# Patient Record
Sex: Male | Born: 1995 | Race: Black or African American | Hispanic: No | Marital: Single | Smoking: Former smoker
Health system: Southern US, Community
[De-identification: ages and names within clinical notes are randomized; demographics above are authoritative.]

---
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
## 2015-01-28 ENCOUNTER — Emergency Department (HOSPITAL_COMMUNITY)
Admission: EM | Admit: 2015-01-28 | Discharge: 2015-01-28 | Disposition: A | Discharge status: Home / Self Care | Payer: Medicaid Other | Attending: Emergency Medicine | Admitting: Emergency Medicine

## 2015-01-28 ENCOUNTER — Encounter (HOSPITAL_COMMUNITY): Payer: Self-pay | Admitting: *Deleted

## 2015-01-28 DIAGNOSIS — Z202 Contact with and (suspected) exposure to infections with a predominantly sexual mode of transmission: Secondary | ICD-10-CM | POA: Insufficient documentation

## 2015-01-28 DIAGNOSIS — Z791 Long term (current) use of non-steroidal anti-inflammatories (NSAID): Secondary | ICD-10-CM | POA: Insufficient documentation

## 2015-01-28 DIAGNOSIS — Z79899 Other long term (current) drug therapy: Secondary | ICD-10-CM | POA: Insufficient documentation

## 2015-01-28 DIAGNOSIS — Z720 Tobacco use: Secondary | ICD-10-CM | POA: Insufficient documentation

## 2015-01-28 MED ORDER — CEFTRIAXONE SODIUM 250 MG IJ SOLR
250.0000 mg | Freq: Once | INTRAMUSCULAR | Status: AC
  Administered 2015-01-28: 250 mg via INTRAMUSCULAR
  Filled 2015-01-28: qty 250

## 2015-01-28 MED ORDER — STERILE WATER FOR INJECTION IJ SOLN
INTRAMUSCULAR | Status: AC
  Administered 2015-01-28: 1.2 mL
  Filled 2015-01-28: qty 10

## 2015-01-28 MED ORDER — AZITHROMYCIN 250 MG PO TABS
1000.0000 mg | ORAL_TABLET | Freq: Every day | ORAL | Status: DC
  Administered 2015-01-28: 1000 mg via ORAL
  Filled 2015-01-28: qty 4

## 2015-01-28 NOTE — Discharge Instructions (Signed)
You have been treated for gonorrhea and chlamydia. Please follow-up with the health department if you have any additional STD needs.

## 2015-01-28 NOTE — ED Notes (Signed)
The pt is here because his sexual partner callked and tokld him 2 days ago that she had a std.  He has no symptoms but oral sex was involved

## 2015-01-28 NOTE — ED Provider Notes (Signed)
CSN: [id number]     Arrival date & time 01/28/15  1954 History  This chart was scribed for non-physician practitioner, Sharilyn SitesLisa Bria Portales, PA-C working with [name], MD by Gwenyth Oberatherine Macek, ED scribe. This patient was seen in room TR08C/TR08C and the patient's care was started at 8:47 PM   Chief Complaint  Patient presents with  . Exposure to STD   The history is provided by the patient. No language interpreter was used.   HPI Comments: Cody Gibson is a 19 y.o. male who presents to the Emergency Department for possible STD exposure. Pt reports that his girlfriend told him 2 days ago that she tested positive for gonorrhea. He notes that she is his only partner and endorses unprotected oral sex. Pt denies fever, chills, dysuria, penile discharge, abdominal pain, swelling of his penis or scrotum, joint pain and rash as associated symptoms.   History reviewed. No pertinent past medical history. History reviewed. No pertinent past surgical history. No family history on file. History  Substance Use Topics  . Smoking status: Current Every Day Smoker -- 0.25 packs/day    Types: Cigarettes  . Smokeless tobacco: Not on file  . Alcohol Use: No    Review of Systems  Constitutional: Negative for fever and chills.  Genitourinary: Negative for dysuria, discharge, penile swelling, scrotal swelling and genital sores.  Skin: Negative for rash.  All other systems reviewed and are negative.     Allergies  Review of patient's allergies indicates no known allergies.  Home Medications   Prior to Admission medications   Medication Sig Start Date End Date Taking? Authorizing Provider  cyclobenzaprine (FLEXERIL) 10 MG tablet Take 1 tablet (10 mg total) by mouth 2 (two) times daily as needed for muscle spasms. Patient not taking: Reported on 11/27/2014 10/20/14   Elpidio AnisShari Upstill, PA-C  ibuprofen (ADVIL,MOTRIN) 800 MG tablet Take 1 tablet (800 mg total) by mouth 3 (three) times daily. 10/20/14   Elpidio AnisShari  Upstill, PA-C  phenazopyridine (PYRIDIUM) 200 MG tablet Take 1 tablet (200 mg total) by mouth 3 (three) times daily. 11/28/14   Junius FinnerErin O'Malley, PA-C   BP 129/77 mmHg  Pulse 93  Temp(Src) 98.3 F (36.8 C)  Resp 16  Ht 5\' 7"  (1.702 m)  Wt 139 lb (63.05 kg)  BMI 21.77 kg/m2  SpO2 97%   Physical Exam  Constitutional: He is oriented to person, place, and time. He appears well-developed and well-nourished.  HENT:  Head: Normocephalic and atraumatic.  Mouth/Throat: Oropharynx is clear and moist.  Eyes: Conjunctivae and EOM are normal. Pupils are equal, round, and reactive to light.  Neck: Normal range of motion.  Cardiovascular: Normal rate, regular rhythm and normal heart sounds.   Pulmonary/Chest: Effort normal and breath sounds normal.  Abdominal: Soft. Bowel sounds are normal.  Musculoskeletal: Normal range of motion.  Neurological: He is alert and oriented to person, place, and time.  Skin: Skin is warm and dry.  Psychiatric: He has a normal mood and affect.  Nursing note and vitals reviewed.   ED Course  Procedures   DIAGNOSTIC STUDIES: Oxygen Saturation is 97% on RA, normal by my interpretation.    COORDINATION OF CARE: 8:52 PM Discussed treatment plan with pt which includes Rocephin and Zithromax. Pt agreed to plan.   Labs Review Labs Reviewed - No data to display  Imaging Review No results found.   EKG Interpretation None      MDM   Final diagnoses:  Exposure to STD   19 year old male  here after exposure to gonorrhea as current sexual partner. He is asymptomatic. He has declined genital swab and opted for treatment only. Patient was given Rocephin and azithromycin in the ED. He was encouraged to use safe sex practices and to follow-up with the health department for any further STD needs.  Discussed plan with patient, he/she acknowledged understanding and agreed with plan of care.  Return precautions given for new or worsening symptoms.  I personally performed  the services described in this documentation, which was scribed in my presence. The recorded information has been reviewed and is accurate.  [name], PA-C 01/28/15 2133  [name], MD 01/28/15 [phone]

## 2015-01-28 NOTE — ED Notes (Signed)
Pt in stating his girlfriend tested positive for an STD and he is here for treatment, denies symptoms, no distress noted

## 2015-03-04 ENCOUNTER — Emergency Department (HOSPITAL_COMMUNITY): Payer: Medicaid Other

## 2015-03-04 ENCOUNTER — Encounter (HOSPITAL_COMMUNITY): Payer: Self-pay | Admitting: *Deleted

## 2015-03-04 ENCOUNTER — Emergency Department (HOSPITAL_COMMUNITY)
Admission: EM | Admit: 2015-03-04 | Discharge: 2015-03-04 | Disposition: A | Discharge status: Home / Self Care | Payer: Medicaid Other | Attending: Emergency Medicine | Admitting: Emergency Medicine

## 2015-03-04 DIAGNOSIS — Y9389 Activity, other specified: Secondary | ICD-10-CM | POA: Insufficient documentation

## 2015-03-04 DIAGNOSIS — Y929 Unspecified place or not applicable: Secondary | ICD-10-CM | POA: Insufficient documentation

## 2015-03-04 DIAGNOSIS — Y990 Civilian activity done for income or pay: Secondary | ICD-10-CM | POA: Insufficient documentation

## 2015-03-04 DIAGNOSIS — W208XXA Other cause of strike by thrown, projected or falling object, initial encounter: Secondary | ICD-10-CM | POA: Insufficient documentation

## 2015-03-04 DIAGNOSIS — N342 Other urethritis: Secondary | ICD-10-CM | POA: Insufficient documentation

## 2015-03-04 DIAGNOSIS — S60221A Contusion of right hand, initial encounter: Secondary | ICD-10-CM | POA: Insufficient documentation

## 2015-03-04 DIAGNOSIS — Z720 Tobacco use: Secondary | ICD-10-CM | POA: Insufficient documentation

## 2015-03-04 MED ORDER — CEFTRIAXONE SODIUM 250 MG IJ SOLR
250.0000 mg | Freq: Once | INTRAMUSCULAR | Status: AC
  Administered 2015-03-04: 250 mg via INTRAMUSCULAR
  Filled 2015-03-04: qty 250

## 2015-03-04 MED ORDER — AZITHROMYCIN 250 MG PO TABS
1000.0000 mg | ORAL_TABLET | Freq: Once | ORAL | Status: AC
  Administered 2015-03-04: 1000 mg via ORAL
  Filled 2015-03-04: qty 4

## 2015-03-04 MED ORDER — IBUPROFEN 800 MG PO TABS: 800.0000 mg | ORAL_TABLET | Freq: Three times a day (TID) | ORAL | Status: AC

## 2015-03-04 MED ORDER — LIDOCAINE HCL (PF) 1 % IJ SOLN
INTRAMUSCULAR | Status: AC
  Administered 2015-03-04: 1 mL
  Filled 2015-03-04: qty 5

## 2015-03-04 NOTE — Discharge Instructions (Signed)
Cryotherapy °Cryotherapy means treatment with cold. Ice or gel packs can be used to reduce both pain and swelling. Ice is the most helpful within the first 24 to 48 hours after an injury or flare-up from overusing a muscle or joint. Sprains, strains, spasms, burning pain, shooting pain, and aches can all be eased with ice. Ice can also be used when recovering from surgery. Ice is effective, has very few side effects, and is safe for most people to use. °PRECAUTIONS  °Ice is not a safe treatment option for people with: °· Raynaud phenomenon. This is a condition affecting small blood vessels in the extremities. Exposure to cold may cause your problems to return. °· Cold hypersensitivity. There are many forms of cold hypersensitivity, including: °¨ Cold urticaria. Red, itchy hives appear on the skin when the tissues begin to warm after being iced. °¨ Cold erythema. This is a red, itchy rash caused by exposure to cold. °¨ Cold hemoglobinuria. Red blood cells break down when the tissues begin to warm after being iced. The hemoglobin that carry oxygen are passed into the urine because they cannot combine with blood proteins fast enough. °· Numbness or altered sensitivity in the area being iced. °If you have any of the following conditions, do not use ice until you have discussed cryotherapy with your caregiver: °· Heart conditions, such as arrhythmia, angina, or chronic heart disease. °· High blood pressure. °· Healing wounds or open skin in the area being iced. °· Current infections. °· Rheumatoid arthritis. °· Poor circulation. °· Diabetes. °Ice slows the blood flow in the region it is applied. This is beneficial when trying to stop inflamed tissues from spreading irritating chemicals to surrounding tissues. However, if you expose your skin to cold temperatures for too long or without the proper protection, you can damage your skin or nerves. Watch for signs of skin damage due to cold. °HOME CARE INSTRUCTIONS °Follow  these tips to use ice and cold packs safely. °· Place a dry or damp towel between the ice and skin. A damp towel will cool the skin more quickly, so you may need to shorten the time that the ice is used. °· For a more rapid response, add gentle compression to the ice. °· Ice for no more than 10 to 20 minutes at a time. The bonier the area you are icing, the less time it will take to get the benefits of ice. °· Check your skin after 5 minutes to make sure there are no signs of a poor response to cold or skin damage. °· Rest 20 minutes or more between uses. °· Once your skin is numb, you can end your treatment. You can test numbness by very lightly touching your skin. The touch should be so light that you do not see the skin dimple from the pressure of your fingertip. When using ice, most people will feel these normal sensations in this order: cold, burning, aching, and numbness. °· Do not use ice on someone who cannot communicate their responses to pain, such as small children or people with dementia. °HOW TO MAKE AN ICE PACK °Ice packs are the most common way to use ice therapy. Other methods include ice massage, ice baths, and cryosprays. Muscle creams that cause a cold, tingly feeling do not offer the same benefits that ice offers and should not be used as a substitute unless recommended by your caregiver. °To make an ice pack, do one of the following: °· Place crushed ice or a   bag of frozen vegetables in a sealable plastic bag. Squeeze out the excess air. Place this bag inside another plastic bag. Slide the bag into a pillowcase or place a damp towel between your skin and the bag.  Mix 3 parts water with 1 part rubbing alcohol. Freeze the mixture in a sealable plastic bag. When you remove the mixture from the freezer, it will be slushy. Squeeze out the excess air. Place this bag inside another plastic bag. Slide the bag into a pillowcase or place a damp towel between your skin and the bag. SEEK MEDICAL CARE  IF:  You develop white spots on your skin. This may give the skin a blotchy (mottled) appearance.  Your skin turns blue or pale.  Your skin becomes waxy or hard.  Your swelling gets worse. MAKE SURE YOU:   Understand these instructions.  Will watch your condition.  Will get help right away if you are not doing well or get worse. Document Released: 05/02/2011 Document Revised: 01/20/2014 Document Reviewed: 05/02/2011 [facility] Patient Information 2015 [place], [place]. This information is not intended to replace advice given to you by your health care provider. Make sure you discuss any questions you have with your health care provider.  Contusion A contusion is a deep bruise. Contusions are the result of an injury that caused bleeding under the skin. The contusion may turn blue, purple, or yellow. Minor injuries will give you a painless contusion, but more severe contusions may stay painful and swollen for a few weeks.  CAUSES  A contusion is usually caused by a blow, trauma, or direct force to an area of the body. SYMPTOMS   Swelling and redness of the injured area.  Bruising of the injured area.  Tenderness and soreness of the injured area.  Pain. DIAGNOSIS  The diagnosis can be made by taking a history and physical exam. An X-ray, CT scan, or MRI may be needed to determine if there were any associated injuries, such as fractures. TREATMENT  Specific treatment will depend on what area of the body was injured. In general, the best treatment for a contusion is resting, icing, elevating, and applying cold compresses to the injured area. Over-the-counter medicines may also be recommended for pain control. Ask your caregiver what the best treatment is for your contusion. HOME CARE INSTRUCTIONS   Put ice on the injured area.  Put ice in a plastic bag.  Place a towel between your skin and the bag.  Leave the ice on for 15-20 minutes, 3-4 times a day, or as directed by your health  care provider.  Only take over-the-counter or prescription medicines for pain, discomfort, or fever as directed by your caregiver. Your caregiver may recommend avoiding anti-inflammatory medicines (aspirin, ibuprofen, and naproxen) for 48 hours because these medicines may increase bruising.  Rest the injured area.  If possible, elevate the injured area to reduce swelling. SEEK IMMEDIATE MEDICAL CARE IF:   You have increased bruising or swelling.  You have pain that is getting worse.  Your swelling or pain is not relieved with medicines. MAKE SURE YOU:   Understand these instructions.  Will watch your condition.  Will get help right away if you are not doing well or get worse. Document Released: 06/15/2005 Document Revised: 09/10/2013 Document Reviewed: 07/11/2011 [facility] Patient Information 2015 [place], [place]. This information is not intended to replace advice given to you by your health care provider. Make sure you discuss any questions you have with your health care provider. Safe  Sex Safe sex is about reducing the risk of giving or getting a sexually transmitted disease (STD). STDs are spread through sexual contact involving the genitals, mouth, or rectum. Some STDs can be cured and others cannot. Safe sex can also prevent unintended pregnancies.  WHAT ARE SOME SAFE SEX PRACTICES?  Limit your sexual activity to only one partner who is having sex with only you.  Talk to your partner about his or her past partners, past STDs, and drug use.  Use a condom every time you have sexual intercourse. This includes vaginal, oral, and anal sexual activity. Both females and males should wear condoms during oral sex. Only use latex or polyurethane condoms and water-based lubricants. Using petroleum-based lubricants or oils to lubricate a condom will weaken the condom and increase the chance that it will break. The condom should be in place from the beginning to the end of sexual activity.  Wearing a condom reduces, but does not completely eliminate, your risk of getting or giving an STD. STDs can be spread by contact with infected body fluids and skin.  Get vaccinated for hepatitis B and HPV.  Avoid alcohol and recreational drugs, which can affect your judgment. You may forget to use a condom or participate in high-risk sex.  For females, avoid douching after sexual intercourse. Douching can spread an infection farther into the reproductive tract.  Check your body for signs of sores, blisters, rashes, or unusual discharge. See your health care provider if you notice any of these signs.  Avoid sexual contact if you have symptoms of an infection or are being treated for an STD. If you or your partner has herpes, avoid sexual contact when blisters are present. Use condoms at all other times.  If you are at risk of being infected with HIV, it is recommended that you take a prescription medicine daily to prevent HIV infection. This is called pre-exposure prophylaxis (PrEP). You are considered at risk if:  You are a man who has sex with other men (MSM).  You are a heterosexual man or woman who is sexually active with more than one partner.  You take drugs by injection.  You are sexually active with a partner who has HIV.  Talk with your health care provider about whether you are at high risk of being infected with HIV. If you choose to begin PrEP, you should first be tested for HIV. You should then be tested every 3 months for as long as you are taking PrEP.  See your health care provider for regular screenings, exams, and tests for other STDs. Before having sex with a new partner, each of you should be screened for STDs and should talk about the results with each other. WHAT ARE THE BENEFITS OF SAFE SEX?   There is less chance of getting or giving an STD.  You can prevent unwanted or unintended pregnancies.  By discussing safe sex concerns with your partner, you may increase  feelings of intimacy, comfort, trust, and honesty between the two of you. Document Released: 10/13/2004 Document Revised: 01/20/2014 Document Reviewed: 02/27/2012 [facility] Patient Information 2015 [place], [place]. This information is not intended to replace advice given to you by your health care provider. Make sure you discuss any questions you have with your health care provider.

## 2015-03-04 NOTE — ED Notes (Signed)
Pt states he also wants to get checked for chlamydia, states his partner has it and he has unprotected sex.

## 2015-03-04 NOTE — ED Notes (Signed)
Patient transported to X-ray 

## 2015-03-04 NOTE — ED Notes (Signed)
Pt states works for a [employer], plates got dropped on pts R hand, swelling noted to pts R hand.

## 2015-03-04 NOTE — ED Provider Notes (Signed)
CSN: [id number]     Arrival date & time 03/04/15  2050 History  This chart was scribed for Elpidio Anis, PA-C, working with Doug Sou, MD by Chestine Spore, ED Scribe. The patient was seen in room WTR7/WTR7 at 9:58 PM.    Chief Complaint  Patient presents with  . Hand Injury    right      The history is provided by the patient. No language interpreter was used.    HPI Comments: Cody Gibson is a 19 y.o. male who presents to the Emergency Department complaining of right hand injury onset yesterday. He reports that he works for a [employer] and plates were dropped onto his right hand with swelling since. Pt also notes that he would like to have a STD check because he recently had unprotected sex with his partner who was dx with chlamydia. He states that he is having associated symptoms of dysuria x 4-5 days. He denies penile discharge, fever, and any other symptoms.   History reviewed. No pertinent past medical history. History reviewed. No pertinent past surgical history. No family history on file. History  Substance Use Topics  . Smoking status: Current Every Day Smoker -- 0.25 packs/day    Types: Cigarettes  . Smokeless tobacco: Not on file  . Alcohol Use: No    Review of Systems  Constitutional: Negative for fever.  Genitourinary: Positive for dysuria. Negative for discharge.  Musculoskeletal: Positive for joint swelling and arthralgias.      Allergies  Review of patient's allergies indicates no known allergies.  Home Medications   Prior to Admission medications   Medication Sig Start Date End Date Taking? Authorizing Provider  cyclobenzaprine (FLEXERIL) 10 MG tablet Take 1 tablet (10 mg total) by mouth 2 (two) times daily as needed for muscle spasms. Patient not taking: Reported on 11/27/2014 10/20/14   Elpidio Anis, PA-C  ibuprofen (ADVIL,MOTRIN) 800 MG tablet Take 1 tablet (800 mg total) by mouth 3 (three) times daily. Patient not taking: Reported on  03/04/2015 10/20/14   Elpidio Anis, PA-C  phenazopyridine (PYRIDIUM) 200 MG tablet Take 1 tablet (200 mg total) by mouth 3 (three) times daily. Patient not taking: Reported on 03/04/2015 11/28/14   Junius Finner, PA-C   BP 140/72 mmHg  Pulse 79  Temp(Src) 98.8 F (37.1 C) (Oral)  Resp 16  SpO2 100% Physical Exam  Constitutional: He is oriented to person, place, and time. He appears well-developed and well-nourished. No distress.  HENT:  Head: Normocephalic and atraumatic.  Eyes: EOM are normal.  Neck: Neck supple. No tracheal deviation present.  Cardiovascular: Normal rate.   Pulmonary/Chest: Effort normal. No respiratory distress.  Genitourinary: Right testis shows no swelling. Left testis shows no swelling. Circumcised. No penile tenderness. No discharge found.  Musculoskeletal: Normal range of motion.  Swollen right hand over the fourth and fifth metacarpals. No wounds. FROM of the DIP and PIP joints of the fourth and fifth fingers. Limited flexion at the MCP joints due to pain and swelling. No tendon deficients.  Neurological: He is alert and oriented to person, place, and time.  Skin: Skin is warm and dry.  Psychiatric: He has a normal mood and affect. His behavior is normal.  Nursing note and vitals reviewed.   ED Course  Procedures (including critical care time) DIAGNOSTIC STUDIES: Oxygen Saturation is 100% on RA, nl by my interpretation.    COORDINATION OF CARE: 10:01 PM-Discussed treatment plan which includes right hand x-ray with pt at bedside and pt  agreed to plan.   Labs Review Labs Reviewed  RPR  HIV ANTIBODY (ROUTINE TESTING)  GC/CHLAMYDIA PROBE AMP ([place]) NOT AT [facility]    Imaging Review Dg Hand Complete Right  03/04/2015   CLINICAL DATA:  Pain and swelling of the fourth and fifth metacarpal area after dropped a platter on the hand tonight.  EXAM: RIGHT HAND - COMPLETE 3+ VIEW  COMPARISON:  08/02/2006  FINDINGS: There is no evidence of fracture or  dislocation. There is no evidence of arthropathy or other focal bone abnormality. Soft tissues are unremarkable.  IMPRESSION: Negative.   Electronically Signed   By: Burman Nieves M.D.   On: 03/04/2015 22:32     EKG Interpretation None      MDM   Final diagnoses:  None    1. Right hand contusion 2. Urethritis  Symptoms of urethritis and STD exposure treated with abx in ED. HIV/RPR/GC/chlamydia pending.   Hand x-ray negative for fracture, requiring supportive care.   I personally performed the services described in this documentation, which was scribed in my presence. The recorded information has been reviewed and is accurate.     Elpidio Anis, PA-C 03/05/15 0762  Doug Sou, MD 03/05/15 [phone]

## 2015-03-05 LAB — HIV ANTIBODY (ROUTINE TESTING W REFLEX): HIV SCREEN 4TH GENERATION: NONREACTIVE

## 2015-03-05 LAB — RPR: RPR: NONREACTIVE

## 2015-03-05 LAB — GC/CHLAMYDIA PROBE AMP (~~LOC~~) NOT AT ARMC
Chlamydia: NEGATIVE
Neisseria Gonorrhea: NEGATIVE

## 2015-06-21 ENCOUNTER — Encounter (HOSPITAL_COMMUNITY): Payer: Self-pay | Admitting: Emergency Medicine

## 2015-06-21 ENCOUNTER — Emergency Department (HOSPITAL_COMMUNITY)
Admission: EM | Admit: 2015-06-21 | Discharge: 2015-06-21 | Disposition: A | Discharge status: Home / Self Care | Payer: Medicaid Other | Attending: Emergency Medicine | Admitting: Emergency Medicine

## 2015-06-21 DIAGNOSIS — Z791 Long term (current) use of non-steroidal anti-inflammatories (NSAID): Secondary | ICD-10-CM | POA: Insufficient documentation

## 2015-06-21 DIAGNOSIS — N342 Other urethritis: Secondary | ICD-10-CM

## 2015-06-21 DIAGNOSIS — R109 Unspecified abdominal pain: Secondary | ICD-10-CM | POA: Insufficient documentation

## 2015-06-21 DIAGNOSIS — Z720 Tobacco use: Secondary | ICD-10-CM | POA: Insufficient documentation

## 2015-06-21 LAB — URINALYSIS, ROUTINE W REFLEX MICROSCOPIC
BILIRUBIN URINE: NEGATIVE
Glucose, UA: NEGATIVE mg/dL
Hgb urine dipstick: NEGATIVE
Ketones, ur: NEGATIVE mg/dL
NITRITE: NEGATIVE
PROTEIN: 30 mg/dL — AB
SPECIFIC GRAVITY, URINE: 1.036 — AB (ref 1.005–1.030)
UROBILINOGEN UA: 1 mg/dL (ref 0.0–1.0)
pH: 7 (ref 5.0–8.0)

## 2015-06-21 LAB — URINE MICROSCOPIC-ADD ON

## 2015-06-21 NOTE — ED Notes (Signed)
Pt sts burning with urination and unsure of discharge; pt admits to unprotected intercourse

## 2015-06-21 NOTE — Discharge Instructions (Signed)
Urethritis °Urethritis is an inflammation of the tube through which urine exits your bladder (urethra).  °CAUSES °Urethritis is often caused by an infection in your urethra. The infection can be viral, like herpes. The infection can also be bacterial, like gonorrhea. °RISK FACTORS °Risk factors of urethritis include: °· Having sex without using a condom. °· Having multiple sexual partners. °· Having poor hygiene. °SIGNS AND SYMPTOMS °Symptoms of urethritis are less noticeable in women than in men. These symptoms include: °· Burning feeling when you urinate (dysuria). °· Discharge from your urethra. °· Blood in your urine (hematuria). °· Urinating more than usual. °DIAGNOSIS  °To confirm a diagnosis of urethritis, your health care provider will do the following: °· Ask about your sexual history. °· Perform a physical exam. °· Have you provide a sample of your urine for lab testing. °· Use a cotton swab to gently collect a sample from your urethra for lab testing. °TREATMENT  °It is important to treat urethritis. Depending on the cause, untreated urethritis may lead to serious genital infections and possibly infertility. Urethritis caused by a bacterial infection is treated with antibiotic medicine. All sexual partners must be treated.  °HOME CARE INSTRUCTIONS °· Do not have sex until the test results are known and treatment is completed, even if your symptoms go away before you finish treatment. °· If you were prescribed an antibiotic, finish it all even if you start to feel better. °SEEK MEDICAL CARE IF:  °· Your symptoms are not improved in 3 days. °· Your symptoms are getting worse. °· You develop abdominal pain or pelvic pain (in women). °· You develop joint pain. °· You have a fever. °SEEK IMMEDIATE MEDICAL CARE IF:  °· You have severe pain in the belly, back, or side. °· You have repeated vomiting. °MAKE SURE YOU: °· Understand these instructions. °· Will watch your condition. °· Will get help right away if you  are not doing well or get worse. °Document Released: 03/01/2001 Document Revised: 01/20/2014 Document Reviewed: 05/06/2013 °[employer]® Patient Information ©2015 [employer]. This information is not intended to replace advice given to you by your health care provider. Make sure you discuss any questions you have with your health care provider. ° °

## 2015-06-21 NOTE — ED Notes (Signed)
Declined W/C at D/C and was escorted to lobby by RN. 

## 2015-06-21 NOTE — ED Provider Notes (Signed)
CSN: [id number]     Arrival date & time 06/21/15  1528 History   First MD Initiated Contact with Patient 06/21/15 1540     Chief Complaint  Patient presents with  . Dysuria     (Consider location/radiation/quality/duration/timing/severity/associated sxs/prior Treatment) HPI Comments: Having burning with urination. Does have unprotected sex. No penile discharge  Patient is a 19 y.o. male presenting with dysuria. The history is provided by the patient. No language interpreter was used.  Dysuria This is a new problem. The current episode started in the past 7 days. The problem occurs constantly. The problem has been unchanged. Pertinent negatives include no abdominal pain. Nothing aggravates the symptoms. He has tried nothing for the symptoms.    History reviewed. No pertinent past medical history. History reviewed. No pertinent past surgical history. History reviewed. No pertinent family history. Social History  Substance Use Topics  . Smoking status: Current Every Day Smoker -- 0.25 packs/day    Types: Cigarettes  . Smokeless tobacco: None  . Alcohol Use: No    Review of Systems  Gastrointestinal: Negative for abdominal pain.  Genitourinary: Positive for dysuria.  All other systems reviewed and are negative.     Allergies  Review of patient's allergies indicates no known allergies.  Home Medications   Prior to Admission medications   Medication Sig Start Date End Date Taking? Authorizing Provider  cyclobenzaprine (FLEXERIL) 10 MG tablet Take 1 tablet (10 mg total) by mouth 2 (two) times daily as needed for muscle spasms. Patient not taking: Reported on 11/27/2014 10/20/14   Elpidio Anis, PA-C  ibuprofen (ADVIL,MOTRIN) 800 MG tablet Take 1 tablet (800 mg total) by mouth 3 (three) times daily. 03/04/15   Elpidio Anis, PA-C  phenazopyridine (PYRIDIUM) 200 MG tablet Take 1 tablet (200 mg total) by mouth 3 (three) times daily. Patient not taking: Reported on 03/04/2015 11/28/14    Junius Finner, PA-C   BP 133/67 mmHg  Pulse 74  Temp(Src) 98 F (36.7 C) (Oral)  Resp 16  Ht  (1.727 m)  Wt 140 lb 8 oz (63.73 kg)  BMI 21.37 kg/m2  SpO2 97% Physical Exam  Constitutional: He is oriented to person, place, and time. He appears well-developed and well-nourished.  Cardiovascular: Normal rate and regular rhythm.   Pulmonary/Chest: Effort normal and breath sounds normal.  Genitourinary: Penis normal.  Musculoskeletal: Normal range of motion.  Neurological: He is alert and oriented to person, place, and time.  Skin: Skin is dry.  Psychiatric: He has a normal mood and affect.  Nursing note and vitals reviewed.   ED Course  Procedures (including critical care time) Labs Review Labs Reviewed  URINALYSIS, ROUTINE W REFLEX MICROSCOPIC (NOT AT [facility]) - Abnormal; Notable for the following:    Color, Urine AMBER (*)    APPearance HAZY (*)    Specific Gravity, Urine 1.036 (*)    Protein, ur 30 (*)    Leukocytes, UA TRACE (*)    All other components within normal limits  URINE MICROSCOPIC-ADD ON  GC/CHLAMYDIA PROBE AMP ( [place]) NOT AT [facility]    Imaging Review No results found. I have personally reviewed and evaluated these images and lab results as part of my medical decision-making.   EKG Interpretation None      MDM   Final diagnoses:  Urethritis    Pt treated here for urethritis. Given std precautions   [name], NP 06/21/15 1656  [name], MD 06/22/15 1513

## 2015-06-22 LAB — GC/CHLAMYDIA PROBE AMP (~~LOC~~) NOT AT ARMC
Chlamydia: NEGATIVE
NEISSERIA GONORRHEA: NEGATIVE

## 2015-12-16 ENCOUNTER — Emergency Department (HOSPITAL_COMMUNITY): Payer: Medicaid Other

## 2015-12-16 ENCOUNTER — Encounter (HOSPITAL_COMMUNITY): Payer: Self-pay | Admitting: *Deleted

## 2015-12-16 ENCOUNTER — Emergency Department (HOSPITAL_COMMUNITY)
Admission: EM | Admit: 2015-12-16 | Discharge: 2015-12-16 | Disposition: A | Discharge status: Home / Self Care | Payer: Medicaid Other | Attending: Emergency Medicine | Admitting: Emergency Medicine

## 2015-12-16 DIAGNOSIS — Z202 Contact with and (suspected) exposure to infections with a predominantly sexual mode of transmission: Secondary | ICD-10-CM | POA: Insufficient documentation

## 2015-12-16 DIAGNOSIS — N451 Epididymitis: Secondary | ICD-10-CM

## 2015-12-16 DIAGNOSIS — N342 Other urethritis: Secondary | ICD-10-CM

## 2015-12-16 DIAGNOSIS — F17210 Nicotine dependence, cigarettes, uncomplicated: Secondary | ICD-10-CM | POA: Insufficient documentation

## 2015-12-16 DIAGNOSIS — Z711 Person with feared health complaint in whom no diagnosis is made: Secondary | ICD-10-CM

## 2015-12-16 DIAGNOSIS — N50811 Right testicular pain: Secondary | ICD-10-CM

## 2015-12-16 DIAGNOSIS — Z791 Long term (current) use of non-steroidal anti-inflammatories (NSAID): Secondary | ICD-10-CM | POA: Insufficient documentation

## 2015-12-16 LAB — URINALYSIS, ROUTINE W REFLEX MICROSCOPIC
GLUCOSE, UA: NEGATIVE mg/dL
Hgb urine dipstick: NEGATIVE
Ketones, ur: NEGATIVE mg/dL
NITRITE: NEGATIVE
Protein, ur: NEGATIVE mg/dL
SPECIFIC GRAVITY, URINE: 1.037 — AB (ref 1.005–1.030)
pH: 6 (ref 5.0–8.0)

## 2015-12-16 LAB — URINE MICROSCOPIC-ADD ON: RBC / HPF: NONE SEEN RBC/hpf (ref 0–5)

## 2015-12-16 MED ORDER — IBUPROFEN 800 MG PO TABS
800.0000 mg | ORAL_TABLET | Freq: Once | ORAL | Status: AC
  Administered 2015-12-16: 800 mg via ORAL
  Filled 2015-12-16: qty 1

## 2015-12-16 MED ORDER — CEFTRIAXONE SODIUM 250 MG IJ SOLR
250.0000 mg | Freq: Once | INTRAMUSCULAR | Status: AC
  Administered 2015-12-16: 250 mg via INTRAMUSCULAR
  Filled 2015-12-16: qty 250

## 2015-12-16 MED ORDER — LIDOCAINE HCL (PF) 1 % IJ SOLN
INTRAMUSCULAR | Status: AC
  Administered 2015-12-16: 5 mL
  Filled 2015-12-16: qty 5

## 2015-12-16 MED ORDER — AZITHROMYCIN 250 MG PO TABS: 1000.0000 mg | ORAL_TABLET | Freq: Once | ORAL | Status: DC

## 2015-12-16 MED ORDER — AZITHROMYCIN 250 MG PO TABS
1000.0000 mg | ORAL_TABLET | Freq: Once | ORAL | Status: AC
  Administered 2015-12-16: 1000 mg via ORAL
  Filled 2015-12-16: qty 4

## 2015-12-16 MED ORDER — DOXYCYCLINE HYCLATE 100 MG PO CAPS: 100.0000 mg | ORAL_CAPSULE | Freq: Two times a day (BID) | ORAL | Status: AC

## 2015-12-16 NOTE — ED Notes (Signed)
Patient transported to Ultrasound 

## 2015-12-16 NOTE — ED Notes (Signed)
Pt given crackers, ginger ale.

## 2015-12-16 NOTE — ED Notes (Signed)
Pt returned from ultrasound

## 2015-12-16 NOTE — Progress Notes (Signed)
[facility] medicaid family planning

## 2015-12-16 NOTE — Discharge Instructions (Signed)
Epididymitis °Epididymitis is swelling (inflammation) of the epididymis. The epididymis is a cord-like structure that is located along the top and back part of the testicle. It collects and stores sperm from the testicle. °This condition can also cause pain and swelling of the testicle and scrotum. Symptoms usually start suddenly (acute epididymitis). Sometimes epididymitis starts gradually and lasts for a while (chronic epididymitis). This type may be harder to treat. °CAUSES °In men 35 and younger, this condition is usually caused by a bacterial infection or sexually transmitted disease (STD), such as: °· Gonorrhea. °· Chlamydia.   °In men 35 and older who do not have anal sex, this condition is usually caused by bacteria from a blockage or abnormalities in the urinary system. These can result from: °· Having a tube placed into the bladder (urinary catheter). °· Having an enlarged or inflamed prostate gland. °· Having recent urinary tract surgery. °In men who have a condition that weakens the body's defense system (immune system), such as HIV, this condition can be caused by:  °· Other bacteria, including tuberculosis and syphilis. °· Viruses. °· Fungi. °Sometimes this condition occurs without infection. That may happen if urine flows backward into the epididymis after heavy lifting or straining. °RISK FACTORS °This condition is more likely to develop in men: °· Who have unprotected sex with more than one partner. °· Who have anal sex.   °· Who have recently had surgery.   °· Who have a urinary catheter. °· Who have urinary problems. °· Who have a suppressed immune system.   °SYMPTOMS  °This condition usually begins suddenly with chills, fever, and pain behind the scrotum and in the testicle. Other symptoms include:  °· Swelling of the scrotum, testicle, or both. °· Pain when ejaculating or urinating. °· Pain in the back or belly. °· Nausea. °· Itching and discharge from the penis. °· Frequent need to pass  urine. °· Redness and tenderness of the scrotum. °DIAGNOSIS °Your health care provider can diagnose this condition based on your symptoms and medical history. Your health care provider will also do a physical exam to ask about your symptoms and check your scrotum and testicle for swelling, pain, and redness. You may also have other tests, including:   °· Examination of discharge from the penis. °· Urine tests for infections, such as STDs.   °Your health care provider may test you for other STDs, including HIV.  °TREATMENT °Treatment for this condition depends on the cause. If your condition is caused by a bacterial infection, oral antibiotic medicine may be prescribed. If the bacterial infection has spread to your blood, you may need to receive IV antibiotics. Nonbacterial epididymitis is treated with home care that includes bed rest and elevation of the scrotum. °Surgery may be needed to treat: °· Bacterial epididymitis that causes pus to build up in the scrotum (abscess). °· Chronic epididymitis that has not responded to other treatments. °HOME CARE INSTRUCTIONS °Medicines  °· Take over-the-counter and prescription medicines only as told by your health care provider.   °· If you were prescribed an antibiotic medicine, take it as told by your health care provider. Do not stop taking the antibiotic even if your condition improves. °Sexual Activity  °· If your epididymitis was caused by an STD, avoid sexual activity until your treatment is complete. °· Inform your sexual partner or partners if you test positive for an STD. They may need to be treated. Do not engage in sexual activity with your partner or partners until their treatment is completed. °General Instructions  °· Return to your normal activities as told   by your health care provider. Ask your health care provider what activities are safe for you.  Keep your scrotum elevated and supported while resting. Ask your health care provider if you should wear a  scrotal support, such as a jockstrap. Wear it as told by your health care provider.  If directed, apply ice to the affected area:   Put ice in a plastic bag.  Place a towel between your skin and the bag.  Leave the ice on for 20 minutes, 2-3 times per day.  Try taking a sitz bath to help with discomfort. This is a warm water bath that is taken while you are sitting down. The water should only come up to your hips and should cover your buttocks. Do this 3-4 times per day or as told by your health care provider.  Keep all follow-up visits as told by your health care provider. This is important. SEEK MEDICAL CARE IF:   You have a fever.   Your pain medicine is not helping.   Your pain is getting worse.   Your symptoms do not improve within three days.   This information is not intended to replace advice given to you by your health care provider. Make sure you discuss any questions you have with your health care provider.   Document Released: 09/02/2000 Document Revised: 05/27/2015 Document Reviewed: 01/21/2015 Elsevier Interactive Patient Education 2016 [employer] Safe Sex  Safe sex is about reducing the risk of giving or getting a sexually transmitted disease (STD). STDs are spread through sexual contact involving the genitals, mouth, or rectum. Some STDs can be cured and others cannot. Safe sex can also prevent unintended pregnancies.  WHAT ARE SOME SAFE SEX PRACTICES?  Limit your sexual activity to only one partner who is having sex with only you.  Talk to your partner about his or her past partners, past STDs, and drug use.  Use a condom every time you have sexual intercourse. This includes vaginal, oral, and anal sexual activity. Both females and males should wear condoms during oral sex. Only use latex or polyurethane condoms and water-based lubricants. Using petroleum-based lubricants or oils to lubricate a condom will weaken the condom and increase the chance that it will  break. The condom should be in place from the beginning to the end of sexual activity. Wearing a condom reduces, but does not completely eliminate, your risk of getting or giving an STD. STDs can be spread by contact with infected body fluids and skin.  Get vaccinated for hepatitis B and HPV.  Avoid alcohol and recreational drugs, which can affect your judgment. You may forget to use a condom or participate in high-risk sex.  For females, avoid douching after sexual intercourse. Douching can spread an infection farther into the reproductive tract.  Check your body for signs of sores, blisters, rashes, or unusual discharge. See your health care provider if you notice any of these signs.  Avoid sexual contact if you have symptoms of an infection or are being treated for an STD. If you or your partner has herpes, avoid sexual contact when blisters are present. Use condoms at all other times.  If you are at risk of being infected with HIV, it is recommended that you take a prescription medicine daily to prevent HIV infection. This is called pre-exposure prophylaxis (PrEP). You are considered at risk if:  You are a man who has sex with other men (MSM).  You are a heterosexual man or woman who is  sexually active with more than one partner.  You take drugs by injection.  You are sexually active with a partner who has HIV. Talk with your health care provider about whether you are at high risk of being infected with HIV. If you choose to begin PrEP, you should first be tested for HIV. You should then be tested every 3 months for as long as you are taking PrEP.  See your health care provider for regular screenings, exams, and tests for other STDs. Before having sex with a new partner, each of you should be screened for STDs and should talk about the results with each other. WHAT ARE THE BENEFITS OF SAFE SEX?  There is less chance of getting or giving an STD.  You can prevent unwanted or unintended pregnancies.   By discussing safe sex concerns with your partner, you may increase feelings of intimacy, comfort, trust, and honesty between the two of you. This information is not intended to replace advice given to you by your health care provider. Make sure you discuss any questions you have with your health care provider.  Document Released: 10/13/2004 Document Revised: 09/26/2014 Document Reviewed: 02/27/2012  Elsevier Interactive Patient Education [employer]

## 2015-12-16 NOTE — ED Notes (Signed)
Pt complains of burning while urinating and testicular pain for the past 5 days. Pt states he had unprotected sex last week. Pt denies hematuria.

## 2015-12-16 NOTE — ED Provider Notes (Signed)
CSN: [id number]     Arrival date & time 12/16/15  1343 History  By signing my name below, I, Placido Sou, attest that this documentation has been prepared under the direction and in the presence of Cheri Fowler, PA-C. Electronically Signed: Placido Sou, ED Scribe. 12/16/2015. 2:05 PM.    Chief Complaint  Patient presents with  . Dysuria  . Testicle Pain   The history is provided by the patient. No language interpreter was used.    HPI Comments: Cody Gibson is a 20 y.o. male who presents to the Emergency Department complaining of constant, moderate, dysuria onset 5 days ago. He reports associated, moderate, sudden onset, right sided testicular pain and mild penile pain which began 3 days ago. He reports having unprotected sex with a male partner last week. He denies penile discharge, hematuria, abd pain, n/v, fevers, chills or any other associated symptoms at this time.   History reviewed. No pertinent past medical history. History reviewed. No pertinent past surgical history. No family history on file. Social History  Substance Use Topics  . Smoking status: Current Every Day Smoker -- 0.25 packs/day    Types: Cigarettes  . Smokeless tobacco: None  . Alcohol Use: No    Review of Systems  Constitutional: Negative for fever and chills.  Gastrointestinal: Negative for nausea, vomiting and abdominal pain.  Genitourinary: Positive for dysuria, penile pain and testicular pain (right sided). Negative for hematuria and discharge.  All other systems reviewed and are negative.  Allergies  Review of patient's allergies indicates no known allergies.  Home Medications   Prior to Admission medications   Medication Sig Start Date End Date Taking? Authorizing Provider  cyclobenzaprine (FLEXERIL) 10 MG tablet Take 1 tablet (10 mg total) by mouth 2 (two) times daily as needed for muscle spasms. Patient not taking: Reported on 11/27/2014 10/20/14   Elpidio Anis, PA-C  doxycycline  (VIBRAMYCIN) 100 MG capsule Take 1 capsule (100 mg total) by mouth 2 (two) times daily. 12/16/15   Cheri Fowler, PA-C  ibuprofen (ADVIL,MOTRIN) 800 MG tablet Take 1 tablet (800 mg total) by mouth 3 (three) times daily. 03/04/15   Elpidio Anis, PA-C  phenazopyridine (PYRIDIUM) 200 MG tablet Take 1 tablet (200 mg total) by mouth 3 (three) times daily. Patient not taking: Reported on 03/04/2015 11/28/14   Junius Finner, PA-C   BP 131/84 mmHg  Pulse 71  Temp(Src) 98.1 F (36.7 C) (Oral)  Resp 18  SpO2 98% Physical Exam  Constitutional: He is oriented to person, place, and time. He appears well-developed and well-nourished.  HENT:  Head: Normocephalic and atraumatic.  Right Ear: External ear normal.  Left Ear: External ear normal.  Eyes: Conjunctivae are normal. No scleral icterus.  Neck: No tracheal deviation present.  Cardiovascular: Normal rate and regular rhythm.   Pulmonary/Chest: Effort normal and breath sounds normal. No respiratory distress. He has no wheezes. He has no rales.  Abdominal: Soft. Bowel sounds are normal. He exhibits no distension. There is no tenderness.  Genitourinary: Right testis shows tenderness. Right testis shows no mass and no swelling. Cremasteric reflex is not absent on the right side. Left testis shows no mass, no swelling and no tenderness. Cremasteric reflex is not absent on the left side. Uncircumcised. Penile tenderness present. No penile erythema. No discharge found.  Chaperone present  Musculoskeletal: Normal range of motion.  Neurological: He is alert and oriented to person, place, and time.  Skin: Skin is warm and dry.  Psychiatric: He has a normal  mood and affect. His behavior is normal.    ED Course  Procedures  DIAGNOSTIC STUDIES: Oxygen Saturation is 98% on RA, normal by my interpretation.    COORDINATION OF CARE: 2:05 PM Discussed next steps with pt. He verbalized understanding and is agreeable with the plan.   Labs Review Labs Reviewed   URINALYSIS, ROUTINE W REFLEX MICROSCOPIC (NOT AT [facility]) - Abnormal; Notable for the following:    Color, Urine AMBER (*)    Specific Gravity, Urine 1.037 (*)    Bilirubin Urine SMALL (*)    Leukocytes, UA SMALL (*)    All other components within normal limits  URINE MICROSCOPIC-ADD ON - Abnormal; Notable for the following:    Squamous Epithelial / LPF 0-5 (*)    Bacteria, UA RARE (*)    All other components within normal limits  URINE CULTURE  RPR  HIV ANTIBODY (ROUTINE TESTING)  GC/CHLAMYDIA PROBE AMP ([place]) NOT [facility]    Imaging Review [place] Scrotum  12/16/2015  CLINICAL DATA:  Acute right testicular pain. EXAM: SCROTAL ULTRASOUND DOPPLER ULTRASOUND OF THE TESTICLES TECHNIQUE: Complete ultrasound examination of the testicles, epididymis, and other scrotal structures was performed. Color and spectral Doppler ultrasound were also utilized to evaluate blood flow to the testicles. COMPARISON:  None. FINDINGS: Right testicle Measurements: 3.6 x 3.5 x 2.2 cm. No mass or microlithiasis visualized. Left testicle Measurements: 3.2 x 2.9 x 1.9 cm. No mass or microlithiasis visualized. Right epididymis:  Normal in size and appearance. Left epididymis:  Normal in size and appearance. Hydrocele:  None visualized. Varicocele:  None visualized. Pulsed Doppler interrogation of both testes demonstrates normal low resistance arterial and venous waveforms bilaterally. IMPRESSION: No evidence of testicular torsion or mass. Electronically Signed   By: Lupita RaiderJames  Green Jr, M.D.   On: 12/16/2015 15:02   [place] Art/ven Flow Abd Pelv Doppler  12/16/2015  CLINICAL DATA:  Acute right testicular pain. EXAM: SCROTAL ULTRASOUND DOPPLER ULTRASOUND OF THE TESTICLES TECHNIQUE: Complete ultrasound examination of the testicles, epididymis, and other scrotal structures was performed. Color and spectral Doppler ultrasound were also utilized to evaluate blood flow to the testicles. COMPARISON:  None. FINDINGS: Right testicle  Measurements: 3.6 x 3.5 x 2.2 cm. No mass or microlithiasis visualized. Left testicle Measurements: 3.2 x 2.9 x 1.9 cm. No mass or microlithiasis visualized. Right epididymis:  Normal in size and appearance. Left epididymis:  Normal in size and appearance. Hydrocele:  None visualized. Varicocele:  None visualized. Pulsed Doppler interrogation of both testes demonstrates normal low resistance arterial and venous waveforms bilaterally. IMPRESSION: No evidence of testicular torsion or mass. Electronically Signed   By: Lupita RaiderJames  Green Jr, M.D.   On: 12/16/2015 15:02   I have personally reviewed and evaluated these lab results as part of my medical decision-making.   EKG Interpretation None      MDM   Final diagnoses:  Concern about STD in male without diagnosis  Testicular pain, right  Epididymitis, right  Urethritis   Patient presents with dysuria. Recent unprotected sex. Patient also reports sudden onset right testicular pain. No fevers, nausea, vomiting, abdominal pain. VSS, NAD. On exam, no penile discharge. Mild penile tenderness. Right scrotum tender without obvious erythema or swelling. Normal cremasteric reflex. We will test for HIV, RPR, GC, chlamydia. UA with small leukocytes and rare bacteria.  Urine culture sent.  I suspect these findings secondary to STD. Patient empirically treated with Rocephin and Azithromycin. Given sudden onset of right scrotal pain, concern for torsion. Scrotal ultrasound  without acute abnormalities.  Will discharge home with doxycycline BID x 10 days for epididymitis.  Discussed return precautions.  Patient advised to inform partners if positive.  Patient agrees and acknowledges the above plan for discharge.   I personally performed the services described in this documentation, which was scribed in my presence. The recorded information has been reviewed and is accurate.    Cheri Fowler, PA-C 12/16/15 1508  Cheri Fowler, PA-C 12/16/15 1510  Linwood Dibbles,  MD 12/16/15 [phone]

## 2015-12-17 LAB — URINE CULTURE: Culture: 9000

## 2015-12-17 LAB — GC/CHLAMYDIA PROBE AMP (~~LOC~~) NOT AT ARMC
CHLAMYDIA, DNA PROBE: NEGATIVE
NEISSERIA GONORRHEA: NEGATIVE

## 2015-12-17 LAB — HIV ANTIBODY (ROUTINE TESTING W REFLEX): HIV Screen 4th Generation wRfx: NONREACTIVE

## 2015-12-17 LAB — RPR: RPR Ser Ql: NONREACTIVE

## 2016-03-29 ENCOUNTER — Emergency Department (HOSPITAL_COMMUNITY)
Admission: EM | Admit: 2016-03-29 | Discharge: 2016-03-29 | Disposition: A | Discharge status: Home / Self Care | Payer: Medicaid Other | Attending: Emergency Medicine | Admitting: Emergency Medicine

## 2016-03-29 ENCOUNTER — Encounter (HOSPITAL_COMMUNITY): Payer: Self-pay | Admitting: *Deleted

## 2016-03-29 DIAGNOSIS — N342 Other urethritis: Secondary | ICD-10-CM | POA: Diagnosis not present

## 2016-03-29 DIAGNOSIS — F17210 Nicotine dependence, cigarettes, uncomplicated: Secondary | ICD-10-CM | POA: Insufficient documentation

## 2016-03-29 DIAGNOSIS — R369 Urethral discharge, unspecified: Secondary | ICD-10-CM | POA: Diagnosis present

## 2016-03-29 LAB — URINALYSIS, ROUTINE W REFLEX MICROSCOPIC
Bilirubin Urine: NEGATIVE
Glucose, UA: NEGATIVE mg/dL
Hgb urine dipstick: NEGATIVE
Ketones, ur: NEGATIVE mg/dL
Nitrite: NEGATIVE
PROTEIN: NEGATIVE mg/dL
Specific Gravity, Urine: 1.02 (ref 1.005–1.030)
pH: 7 (ref 5.0–8.0)

## 2016-03-29 LAB — URINE MICROSCOPIC-ADD ON: RBC / HPF: NONE SEEN RBC/hpf (ref 0–5)

## 2016-03-29 MED ORDER — CEFTRIAXONE SODIUM 250 MG IJ SOLR
250.0000 mg | Freq: Once | INTRAMUSCULAR | Status: AC
  Administered 2016-03-29: 250 mg via INTRAMUSCULAR
  Filled 2016-03-29: qty 250

## 2016-03-29 MED ORDER — AZITHROMYCIN 250 MG PO TABS
1000.0000 mg | ORAL_TABLET | Freq: Once | ORAL | Status: AC
  Administered 2016-03-29: 1000 mg via ORAL
  Filled 2016-03-29: qty 4

## 2016-03-29 MED ORDER — LIDOCAINE HCL (PF) 1 % IJ SOLN
INTRAMUSCULAR | Status: AC
  Administered 2016-03-29: 0.9 mL
  Filled 2016-03-29: qty 5

## 2016-03-29 NOTE — ED Provider Notes (Signed)
CSN: [id number]     Arrival date & time 03/29/16  1814 History   First MD Initiated Contact with Patient 03/29/16 2003     Chief Complaint  Patient presents with  . SEXUALLY TRANSMITTED DISEASE     (Consider location/radiation/quality/duration/timing/severity/associated sxs/prior Treatment) Patient is a 20 y.o. male presenting with penile discharge. The history is provided by the patient.  Penile Discharge This is a new problem. Episode onset: 5 days. The problem occurs constantly. The problem has been gradually worsening. Associated symptoms comments: Discharge, white. Nothing aggravates the symptoms. Nothing relieves the symptoms. He has tried nothing for the symptoms.    History reviewed. No pertinent past medical history. History reviewed. No pertinent past surgical history. History reviewed. No pertinent family history. Social History  Substance Use Topics  . Smoking status: Current Every Day Smoker -- 0.25 packs/day    Types: Cigarettes  . Smokeless tobacco: None  . Alcohol Use: No    Review of Systems  Genitourinary: Positive for discharge.  All other systems reviewed and are negative.     Allergies  Review of patient's allergies indicates no known allergies.  Home Medications   Prior to Admission medications   Medication Sig Start Date End Date Taking? Authorizing Provider  cyclobenzaprine (FLEXERIL) 10 MG tablet Take 1 tablet (10 mg total) by mouth 2 (two) times daily as needed for muscle spasms. Patient not taking: Reported on 11/27/2014 10/20/14   Elpidio AnisShari Upstill, PA-C  doxycycline (VIBRAMYCIN) 100 MG capsule Take 1 capsule (100 mg total) by mouth 2 (two) times daily. 12/16/15   Cheri FowlerKayla Rose, PA-C  ibuprofen (ADVIL,MOTRIN) 800 MG tablet Take 1 tablet (800 mg total) by mouth 3 (three) times daily. 03/04/15   Elpidio AnisShari Upstill, PA-C  phenazopyridine (PYRIDIUM) 200 MG tablet Take 1 tablet (200 mg total) by mouth 3 (three) times daily. Patient not taking: Reported on 03/04/2015  11/28/14   Junius FinnerErin O'Malley, PA-C   BP 107/68 mmHg  Pulse 70  Temp(Src) 98.7 F (37.1 C) (Oral)  Resp 18  SpO2 99% Physical Exam  Constitutional: He is oriented to person, place, and time. He appears well-developed and well-nourished. No distress.  HENT:  Head: Normocephalic and atraumatic.  Eyes: Conjunctivae are normal.  Neck: Neck supple. No tracheal deviation present.  Cardiovascular: Normal rate, regular rhythm and normal heart sounds.   Pulmonary/Chest: Effort normal. No respiratory distress.  Abdominal: Soft. He exhibits no distension.  Genitourinary: Testes normal and penis normal. Right testis shows no tenderness. Left testis shows no tenderness. Circumcised.  Neurological: He is alert and oriented to person, place, and time.  Skin: Skin is warm and dry.  Psychiatric: He has a normal mood and affect.  Vitals reviewed.   ED Course  Procedures (including critical care time) Labs Review Labs Reviewed  GC/CHLAMYDIA PROBE AMP ([place]) NOT AT [facility]    Imaging Review No results found. I have personally reviewed and evaluated these images and lab results as part of my medical decision-making.   EKG Interpretation None      MDM   Final diagnoses:  Urethritis   20 y.o. male presents with discharge form penis starting 5 days ago. He had unprotected sex with a new partner 2 weeks ago prior to the onset of symptoms. He does have some leukocytes on his UA and given his typical symptoms will cover empirically for GC chlamydia pending formal diagnosis and culture. I explained that he needed to inform all sexual partners who need to be treated as well.  Lyndal Pulley, MD 03/30/16 [phone]

## 2016-03-29 NOTE — ED Notes (Signed)
Pt left before being discharged and receiving his paperwork.

## 2016-03-29 NOTE — ED Notes (Signed)
Pt reports discharge and dysuria for 4-5 days. Pt states his girlfriend was recently dx with a STD.

## 2016-03-29 NOTE — Discharge Instructions (Signed)
Urethritis, Adult °Urethritis is an inflammation of the tube through which urine exits your bladder (urethra).  °CAUSES °Urethritis is often caused by an infection in your urethra. The infection can be viral, like herpes. The infection can also be bacterial, like gonorrhea. °RISK FACTORS °Risk factors of urethritis include: °· Having sex without using a condom. °· Having multiple sexual partners. °· Having poor hygiene. °SIGNS AND SYMPTOMS °Symptoms of urethritis are less noticeable in women than in men. These symptoms include: °· Burning feeling when you urinate (dysuria). °· Discharge from your urethra. °· Blood in your urine (hematuria). °· Urinating more than usual. °DIAGNOSIS  °To confirm a diagnosis of urethritis, your health care provider will do the following: °· Ask about your sexual history. °· Perform a physical exam. °· Have you provide a sample of your urine for lab testing. °· Use a cotton swab to gently collect a sample from your urethra for lab testing. °TREATMENT  °It is important to treat urethritis. Depending on the cause, untreated urethritis may lead to serious genital infections and possibly infertility. Urethritis caused by a bacterial infection is treated with antibiotic medicine. All sexual partners must be treated.  °HOME CARE INSTRUCTIONS °· Do not have sex until the test results are known and treatment is completed, even if your symptoms go away before you finish treatment. °· If you were prescribed an antibiotic, finish it all even if you start to feel better. °SEEK MEDICAL CARE IF:  °· Your symptoms are not improved in 3 days. °· Your symptoms are getting worse. °· You develop abdominal pain or pelvic pain (in women). °· You develop joint pain. °· You have a fever. °SEEK IMMEDIATE MEDICAL CARE IF:  °· You have severe pain in the belly, back, or side. °· You have repeated vomiting. °MAKE SURE YOU: °· Understand these instructions. °· Will watch your condition. °· Will get help right away  if you are not doing well or get worse. °  °This information is not intended to replace advice given to you by your health care provider. Make sure you discuss any questions you have with your health care provider. °  °Document Released: 03/01/2001 Document Revised: 01/20/2015 Document Reviewed: 05/06/2013 °Elsevier Interactive Patient Education ©2016 [employer] ° °

## 2016-08-16 IMAGING — US US SCROTUM
1 series · 14 of 25 positions shown · non-contrast
Comparison: None.

CLINICAL DATA: Acute right testicular pain.

EXAM:
SCROTAL ULTRASOUND
DOPPLER ULTRASOUND OF THE TESTICLES
TECHNIQUE: Complete ultrasound examination of the testicles, epididymis, and
other scrotal structures was performed. Color and spectral Doppler
ultrasound were also utilized to evaluate blood flow to the
testicles.

[Series 1: us scrotum · 0.07mm/px · 14 of 55 slices shown]
[im 1/55]
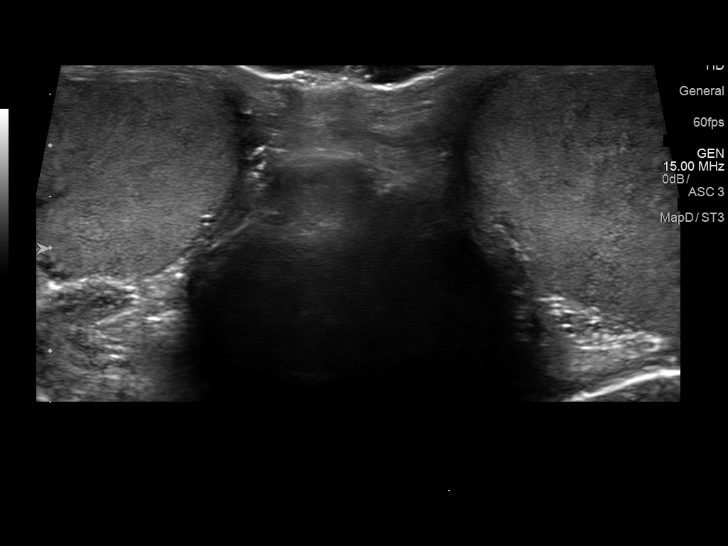
[im 5/55]
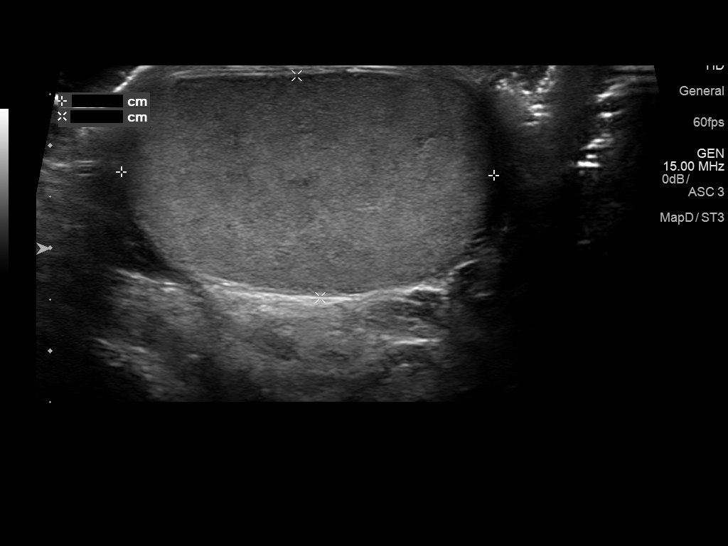
[im 10/55]
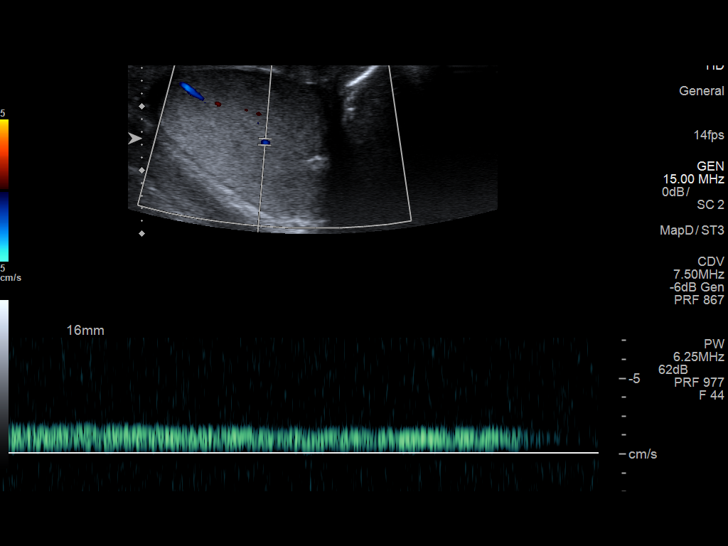
[im 14/55]
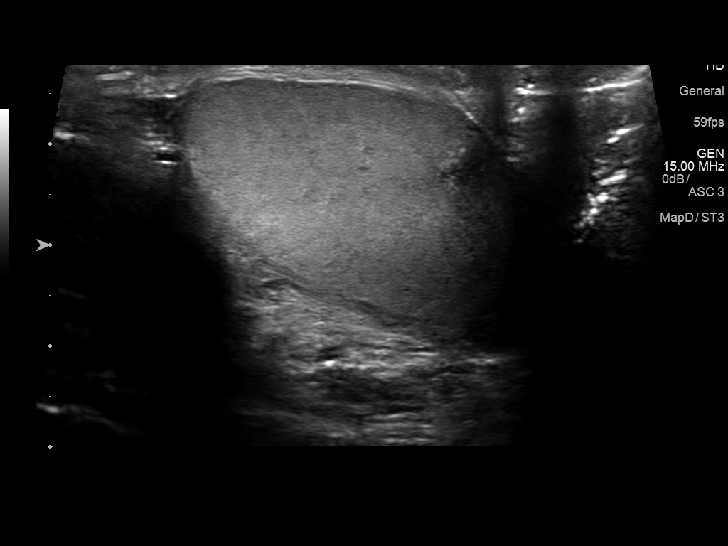
[im 19/55]
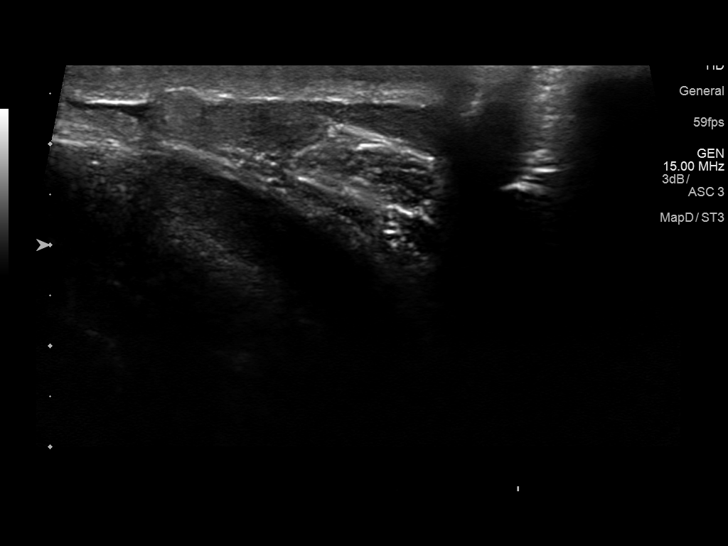
[im 21/55]
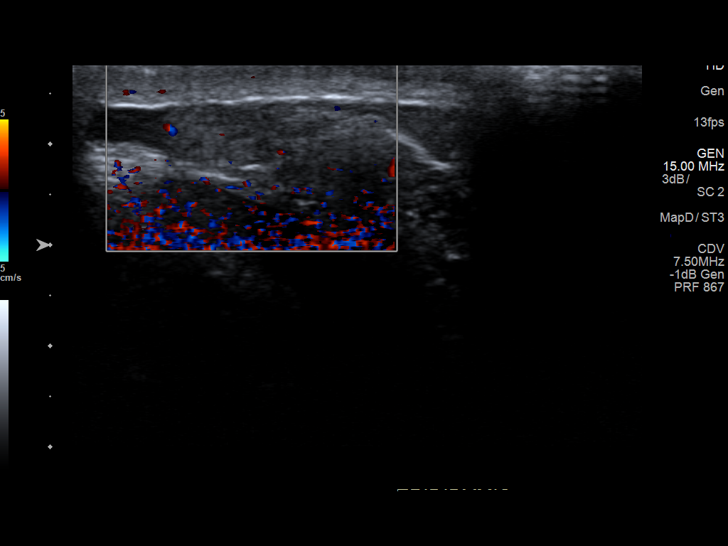
[im 25/55]
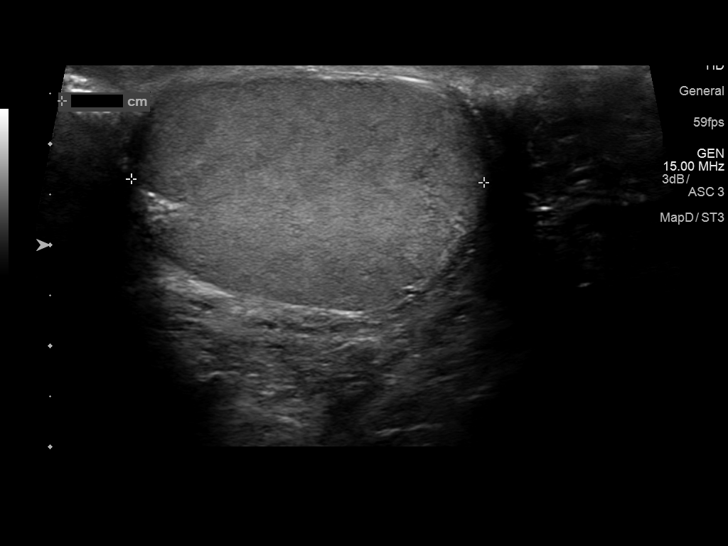
[im 30/55]
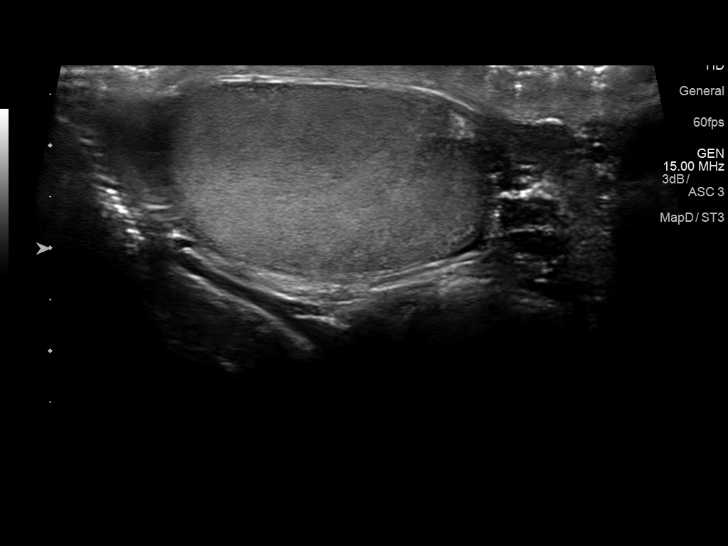
[im 34/55]
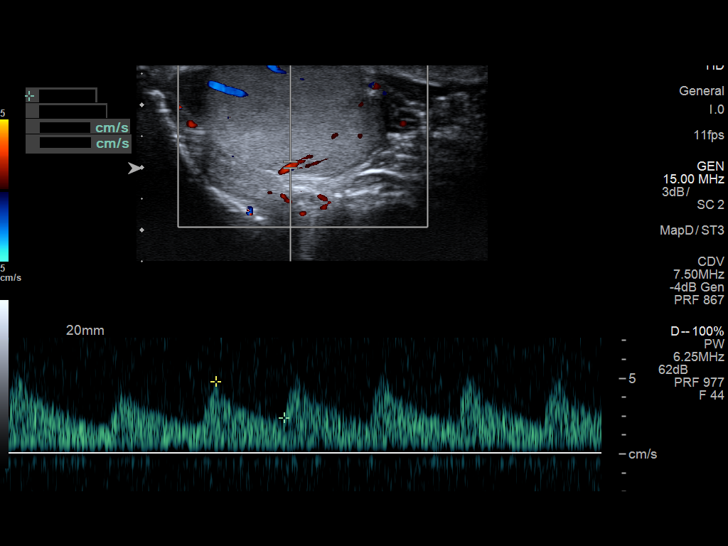
[im 37/55]
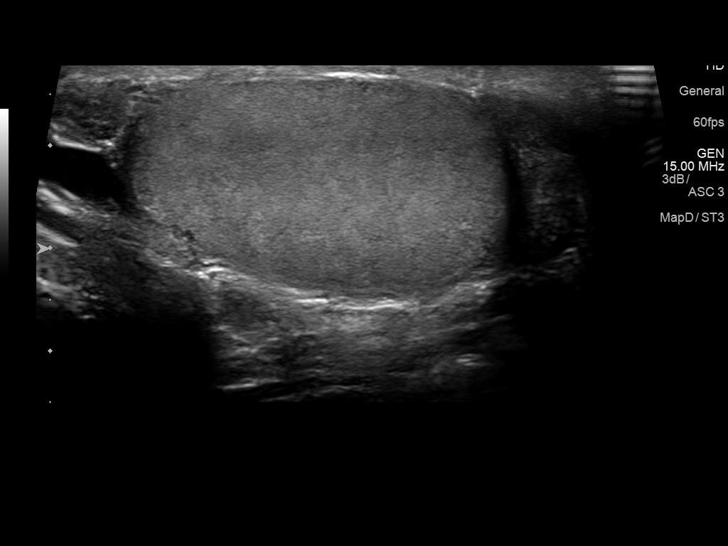
[im 41/55]
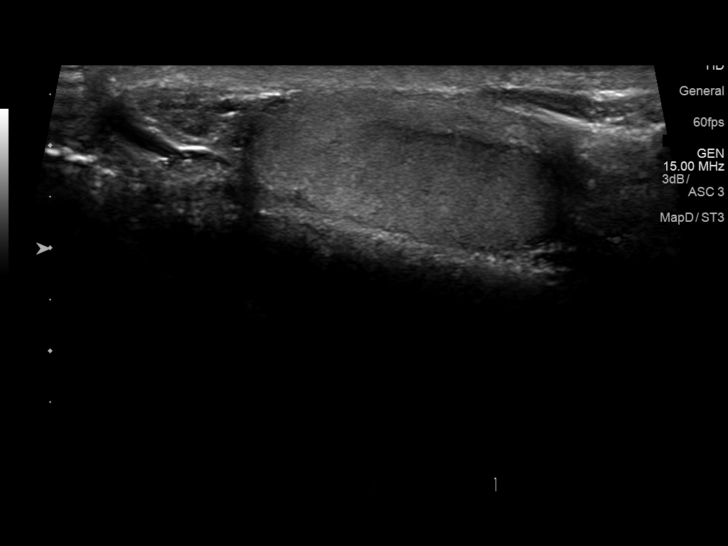
[im 46/55]
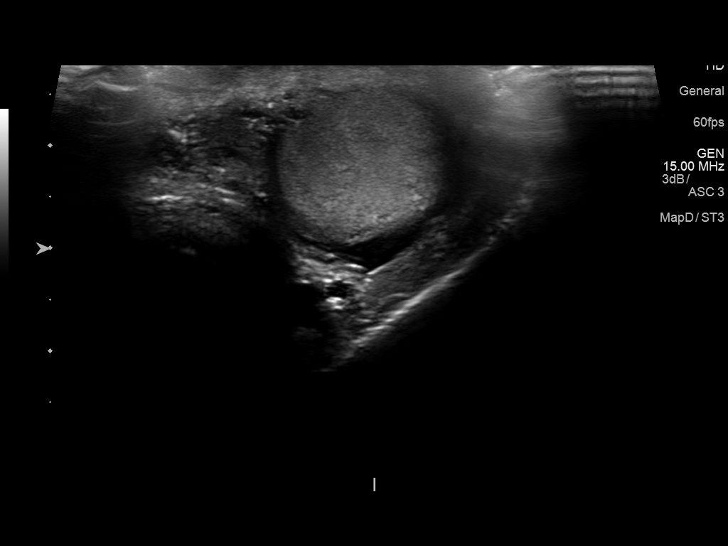
[im 50/55]
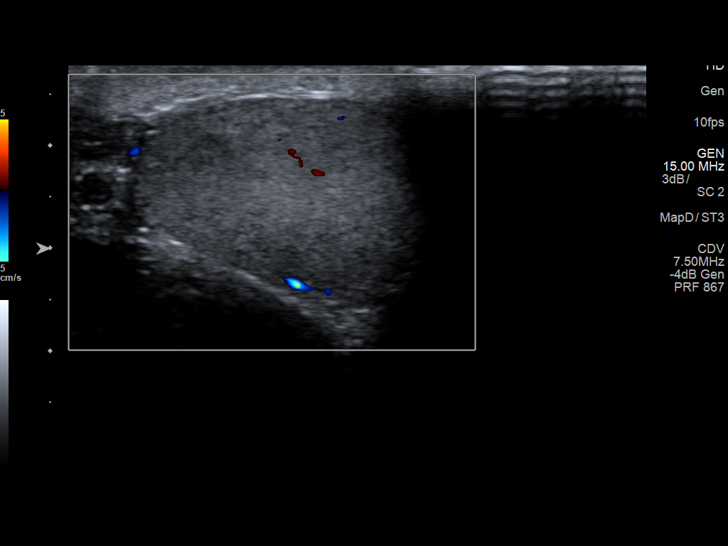
[im 55/55]
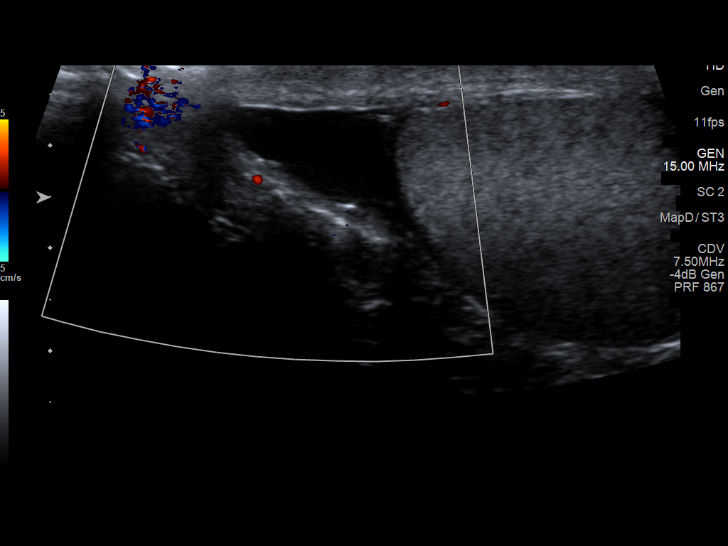

[14 of 25 positions shown; findings below may reference images not displayed]

FINDINGS: Right testicle

Measurements: 3.6 x 3.5 x 2.2 cm. No mass or microlithiasis
visualized.

Left testicle

Measurements: 3.2 x 2.9 x 1.9 cm. No mass or microlithiasis
visualized.

Right epididymis:  Normal in size and appearance.

Left epididymis:  Normal in size and appearance.

Hydrocele:  None visualized.

Varicocele:  None visualized.

Pulsed Doppler interrogation of both testes demonstrates normal low
resistance arterial and venous waveforms bilaterally.
IMPRESSION: No evidence of testicular torsion or mass.

## 2018-04-06 ENCOUNTER — Other Ambulatory Visit: Payer: Self-pay

## 2018-04-06 ENCOUNTER — Emergency Department (HOSPITAL_COMMUNITY)
Admission: EM | Admit: 2018-04-06 | Discharge: 2018-04-06 | Disposition: A | Discharge status: Home / Self Care | Payer: Self-pay | Attending: Emergency Medicine | Admitting: Emergency Medicine

## 2018-04-06 ENCOUNTER — Encounter (HOSPITAL_COMMUNITY): Payer: Self-pay | Admitting: Obstetrics and Gynecology

## 2018-04-06 DIAGNOSIS — F17210 Nicotine dependence, cigarettes, uncomplicated: Secondary | ICD-10-CM | POA: Insufficient documentation

## 2018-04-06 DIAGNOSIS — Z202 Contact with and (suspected) exposure to infections with a predominantly sexual mode of transmission: Secondary | ICD-10-CM | POA: Insufficient documentation

## 2018-04-06 MED ORDER — LIDOCAINE HCL (PF) 1 % IJ SOLN
INTRAMUSCULAR | Status: AC
  Administered 2018-04-06: 1.5 mL
  Filled 2018-04-06: qty 30

## 2018-04-06 MED ORDER — AZITHROMYCIN 250 MG PO TABS
1000.0000 mg | ORAL_TABLET | Freq: Once | ORAL | Status: AC
  Administered 2018-04-06: 1000 mg via ORAL
  Filled 2018-04-06: qty 4

## 2018-04-06 MED ORDER — CEFTRIAXONE SODIUM 250 MG IJ SOLR
250.0000 mg | Freq: Once | INTRAMUSCULAR | Status: AC
  Administered 2018-04-06: 250 mg via INTRAMUSCULAR
  Filled 2018-04-06: qty 250

## 2018-04-06 NOTE — ED Triage Notes (Signed)
Pt reports his girlfriend tested positive for chlamydia and he is seeking treatment as he was exposed.

## 2018-04-06 NOTE — Discharge Instructions (Addendum)
Follow up with the health department for additional screening.  °

## 2018-04-06 NOTE — ED Provider Notes (Signed)
[facility]-EMERGENCY DEPT Provider Note   CSN: [id number] Arrival date & time: 04/06/18  1216     History   Chief Complaint Chief Complaint  Patient presents with  . Exposure to STD    HPI Cody Gibson is a 22 y.o. male who presents to the ED with request for STI testing. Patient reports that his girlfriend tested positive for Chlamydia and he needs treatment. Patient reports he has noted a small amount of penile discharge.   HPI  History reviewed. No pertinent past medical history.  There are no active problems to display for this patient.   History reviewed. No pertinent surgical history.      Home Medications    Prior to Admission medications   Medication Sig Start Date End Date Taking? Authorizing Provider  cyclobenzaprine (FLEXERIL) 10 MG tablet Take 1 tablet (10 mg total) by mouth 2 (two) times daily as needed for muscle spasms. Patient not taking: Reported on 11/27/2014 10/20/14   Elpidio Anis, PA-C  doxycycline (VIBRAMYCIN) 100 MG capsule Take 1 capsule (100 mg total) by mouth 2 (two) times daily. 12/16/15   Cheri Fowler, PA-C  ibuprofen (ADVIL,MOTRIN) 800 MG tablet Take 1 tablet (800 mg total) by mouth 3 (three) times daily. 03/04/15   Elpidio Anis, PA-C  phenazopyridine (PYRIDIUM) 200 MG tablet Take 1 tablet (200 mg total) by mouth 3 (three) times daily. Patient not taking: Reported on 03/04/2015 11/28/14   Rolla Plate    Family History No family history on file.  Social History Social History   Tobacco Use  . Smoking status: Current Every Day Smoker    Packs/day: 0.25    Types: Cigarettes  Substance Use Topics  . Alcohol use: No  . Drug use: No     Allergies   Patient has no known allergies.   Review of Systems Review of Systems  Genitourinary: Positive for discharge and dysuria.  All other systems reviewed and are negative.    Physical Exam Updated Vital Signs BP 112/75 (BP Location: Left Arm)   Pulse 65    Temp 98.4 F (36.9 C)   Resp 15   SpO2 98%   Physical Exam  Constitutional: He appears well-developed and well-nourished. No distress.  HENT:  Head: Normocephalic.  Eyes: EOM are normal.  Neck: Neck supple.  Cardiovascular: Normal rate.  Pulmonary/Chest: Effort normal.  Abdominal: Soft. There is no tenderness.  Genitourinary: Testes normal. Circumcised. Discharge found.  Musculoskeletal: Normal range of motion.  Lymphadenopathy: No inguinal adenopathy noted on the right or left side.  Neurological: He is alert.  Skin: Skin is warm and dry.  Psychiatric: He has a normal mood and affect.  Nursing note and vitals reviewed.    ED Treatments / Results  Labs (all labs ordered are listed, but only abnormal results are displayed) Labs Reviewed  GC/CHLAMYDIA PROBE AMP ([place]) NOT AT [facility]   Radiology No results found.  Procedures Procedures (including critical care time)  Medications Ordered in ED Medications  cefTRIAXone (ROCEPHIN) injection 250 mg (has no administration in time range)  azithromycin (ZITHROMAX) tablet 1,000 mg (has no administration in time range)     Initial Impression / Assessment and Plan / ED Course  I have reviewed the triage vital signs and the nursing notes. Pt presents with concerns for possible STD.  Pt understands that they have GC/Chlamydia cultures pending and that they will need to inform all sexual partners if results return positive. Pt has been treated  prophylactically with azithromycin and Rocephin due to pts history and exposure. Instructions to follow up with [facility] for additional screening. Discussed importance of using protection when sexually active.   Final Clinical Impressions(s) / ED Diagnoses   Final diagnoses:  STD exposure    ED Discharge Orders    None       Kerrie Buffaloeese, Hope [place], [place] 04/06/18 1416    Azalia Bilisampos, Kevin, MD 04/06/18 1547

## 2018-04-09 LAB — GC/CHLAMYDIA PROBE AMP (~~LOC~~) NOT AT ARMC
Chlamydia: POSITIVE — AB
NEISSERIA GONORRHEA: NEGATIVE

## 2019-03-06 ENCOUNTER — Emergency Department (HOSPITAL_COMMUNITY)
Admission: EM | Admit: 2019-03-06 | Discharge: 2019-03-07 | Disposition: A | Discharge status: Home / Self Care | Payer: Medicaid Other | Attending: Emergency Medicine | Admitting: Emergency Medicine

## 2019-03-06 ENCOUNTER — Other Ambulatory Visit: Payer: Self-pay

## 2019-03-06 DIAGNOSIS — Z113 Encounter for screening for infections with a predominantly sexual mode of transmission: Secondary | ICD-10-CM | POA: Insufficient documentation

## 2019-03-06 DIAGNOSIS — N342 Other urethritis: Secondary | ICD-10-CM | POA: Insufficient documentation

## 2019-03-06 DIAGNOSIS — F17210 Nicotine dependence, cigarettes, uncomplicated: Secondary | ICD-10-CM | POA: Insufficient documentation

## 2019-03-06 DIAGNOSIS — Z7251 High risk heterosexual behavior: Secondary | ICD-10-CM | POA: Insufficient documentation

## 2019-03-07 ENCOUNTER — Other Ambulatory Visit: Payer: Self-pay

## 2019-03-07 ENCOUNTER — Encounter (HOSPITAL_COMMUNITY): Payer: Self-pay | Admitting: Emergency Medicine

## 2019-03-07 LAB — URINALYSIS, ROUTINE W REFLEX MICROSCOPIC
Bilirubin Urine: NEGATIVE
Glucose, UA: NEGATIVE mg/dL
Hgb urine dipstick: NEGATIVE
Ketones, ur: NEGATIVE mg/dL
Leukocytes,Ua: NEGATIVE
Nitrite: NEGATIVE
Protein, ur: NEGATIVE mg/dL
Specific Gravity, Urine: 1.033 — ABNORMAL HIGH (ref 1.005–1.030)
pH: 6 (ref 5.0–8.0)

## 2019-03-07 MED ORDER — CEFTRIAXONE SODIUM 250 MG IJ SOLR
250.0000 mg | Freq: Once | INTRAMUSCULAR | Status: AC
  Administered 2019-03-07: 250 mg via INTRAMUSCULAR
  Filled 2019-03-07: qty 250

## 2019-03-07 MED ORDER — LIDOCAINE HCL (PF) 1 % IJ SOLN
INTRAMUSCULAR | Status: AC
  Administered 2019-03-07: 5 mL
  Filled 2019-03-07: qty 5

## 2019-03-07 MED ORDER — AZITHROMYCIN 250 MG PO TABS
1000.0000 mg | ORAL_TABLET | Freq: Once | ORAL | Status: AC
  Administered 2019-03-07: 1000 mg via ORAL
  Filled 2019-03-07: qty 4

## 2019-03-07 NOTE — ED Triage Notes (Signed)
C/o burning with urination x 6-7 days.  Pt here with significant other for both of them to be tested for STD.

## 2019-03-07 NOTE — ED Provider Notes (Signed)
[facility]   CSN: [id number] Arrival date & time: 03/06/19  2336    History   Chief Complaint Chief Complaint  Patient presents with  . STD check    HPI Cody Gibson is a 23 y.o. male.     23 year old male presents to the emergency department for evaluation of dysuria x1 week.  Onset was shortly after having sex with new male partner.  He has been sexually active with 2 partners in the past 6 months.  Reports history of unprotected sexual intercourse.  Chart review suggests frequent visits to ED's for STD checks and treatment.  He has not had any fevers, penile discharge, N/V, abdominal pain.  The history is provided by the patient. No language interpreter was used.    History reviewed. No pertinent past medical history.  There are no active problems to display for this patient.   History reviewed. No pertinent surgical history.      Home Medications    Prior to Admission medications   Medication Sig Start Date End Date Taking? Authorizing Provider  cyclobenzaprine (FLEXERIL) 10 MG tablet Take 1 tablet (10 mg total) by mouth 2 (two) times daily as needed for muscle spasms. Patient not taking: Reported on 11/27/2014 10/20/14   Elpidio AnisUpstill, Shari, PA-C  doxycycline (VIBRAMYCIN) 100 MG capsule Take 1 capsule (100 mg total) by mouth 2 (two) times daily. 12/16/15   Cheri Fowlerose, Kayla, PA-C  ibuprofen (ADVIL,MOTRIN) 800 MG tablet Take 1 tablet (800 mg total) by mouth 3 (three) times daily. 03/04/15   Elpidio AnisUpstill, Shari, PA-C  phenazopyridine (PYRIDIUM) 200 MG tablet Take 1 tablet (200 mg total) by mouth 3 (three) times daily. Patient not taking: Reported on 03/04/2015 11/28/14   Rolla PlatePhelps, Erin O, PA-C    Family History No family history on file.  Social History Social History   Tobacco Use  . Smoking status: Current Every Day Smoker    Packs/day: 0.25    Types: Cigarettes  Substance Use Topics  . Alcohol use: No  . Drug use: No     Allergies   Patient has no known allergies.   Review of Systems Review of Systems Ten systems reviewed and are negative for acute change, except as noted in the HPI.    Physical Exam Updated Vital Signs BP 130/73 (BP Location: Right Arm)   Pulse 86   Temp 99.1 F (37.3 C) (Oral)   Resp 16   SpO2 98%   Physical Exam Vitals signs and nursing note reviewed.  Constitutional:      General: He is not in acute distress.    Appearance: He is well-developed. He is not diaphoretic.  HENT:     Head: Normocephalic and atraumatic.  Eyes:     General: No scleral icterus.    Conjunctiva/sclera: Conjunctivae normal.  Neck:     Musculoskeletal: Normal range of motion.  Pulmonary:     Effort: Pulmonary effort is normal. No respiratory distress.  Genitourinary:    Comments: Deferred; patient declines exam. Musculoskeletal: Normal range of motion.  Skin:    General: Skin is warm and dry.     Coloration: Skin is not pale.     Findings: No erythema or rash.  Neurological:     Mental Status: He is alert and oriented to person, place, and time.  Psychiatric:        Behavior: Behavior normal.      ED Treatments / Results  Labs (all labs ordered are listed,  but only abnormal results are displayed) Labs Reviewed  URINALYSIS, ROUTINE W REFLEX MICROSCOPIC - Abnormal; Notable for the following components:      Result Value   Specific Gravity, Urine 1.033 (*)    All other components within normal limits  GC/CHLAMYDIA PROBE AMP ([place]) NOT AT [facility]    EKG None  Radiology No results found.  Procedures Procedures (including critical care time)  Medications Ordered in ED Medications  cefTRIAXone (ROCEPHIN) injection 250 mg (has no administration in time range)  azithromycin (ZITHROMAX) tablet 1,000 mg (has no administration in time range)     Initial Impression / Assessment and Plan / ED Course  I have reviewed the triage vital signs and the nursing notes.  Pertinent  labs & imaging results that were available during my care of the patient were reviewed by me and considered in my medical decision making (see chart for details).        Patient to be discharged with instructions to follow up with the Health Department. Discussed importance of using protection when sexually active. Patient understands that they have GC/Chlamydia cultures pending and that they will need to inform all sexual partners if results return positive. Pt has been treated prophylacticly with azithromycin and Rocephin prior to discharge. Return precautions discussed and provided. Patient discharged in stable condition with no unaddressed concerns.   Final Clinical Impressions(s) / ED Diagnoses   Final diagnoses:  Urethritis  History of unprotected sex    ED Discharge Orders    None       Antonietta Breach, PA-C 03/07/19 0241    Merrily Pew, MD 03/07/19 [phone]

## 2019-03-07 NOTE — Discharge Instructions (Signed)
You have been treated for gonorrhea and chlamydia today. Follow-up with the health department in 48 hours for the results of your STD tests. If you test positive for STDs, notify all sexual partners of their need to be tested and treated as well. Do not engage in sexual intercourse for one week. Use a condom when sexually active. °

## 2019-03-08 LAB — GC/CHLAMYDIA PROBE AMP (~~LOC~~) NOT AT ARMC
Chlamydia: NEGATIVE
Neisseria Gonorrhea: NEGATIVE

## 2019-09-30 ENCOUNTER — Encounter (HOSPITAL_COMMUNITY): Payer: Self-pay | Admitting: Emergency Medicine

## 2019-09-30 ENCOUNTER — Other Ambulatory Visit: Payer: Self-pay

## 2019-09-30 ENCOUNTER — Emergency Department (HOSPITAL_COMMUNITY)
Admission: EM | Admit: 2019-09-30 | Discharge: 2019-09-30 | Disposition: A | Discharge status: Home / Self Care | Payer: Medicaid Other | Attending: Emergency Medicine | Admitting: Emergency Medicine

## 2019-09-30 DIAGNOSIS — R350 Frequency of micturition: Secondary | ICD-10-CM | POA: Insufficient documentation

## 2019-09-30 DIAGNOSIS — Z5321 Procedure and treatment not carried out due to patient leaving prior to being seen by health care provider: Secondary | ICD-10-CM | POA: Insufficient documentation

## 2019-09-30 LAB — URINALYSIS, ROUTINE W REFLEX MICROSCOPIC
Bilirubin Urine: NEGATIVE
Glucose, UA: NEGATIVE mg/dL
Hgb urine dipstick: NEGATIVE
Ketones, ur: NEGATIVE mg/dL
Leukocytes,Ua: NEGATIVE
Nitrite: NEGATIVE
Protein, ur: NEGATIVE mg/dL
Specific Gravity, Urine: 1.016 (ref 1.005–1.030)
pH: 6 (ref 5.0–8.0)

## 2019-09-30 NOTE — ED Notes (Signed)
Called for pt x3 for room. No answer 

## 2019-09-30 NOTE — ED Notes (Signed)
Urine culture sent to lab.

## 2019-09-30 NOTE — ED Triage Notes (Signed)
Pt reports urinary frequency x 1 week, denies any pain with urination.

## 2019-12-10 ENCOUNTER — Other Ambulatory Visit: Payer: Self-pay

## 2019-12-10 ENCOUNTER — Emergency Department (HOSPITAL_COMMUNITY)
Admission: EM | Admit: 2019-12-10 | Discharge: 2019-12-10 | Disposition: A | Discharge status: Home / Self Care | Payer: Self-pay | Attending: Emergency Medicine | Admitting: Emergency Medicine

## 2019-12-10 DIAGNOSIS — R300 Dysuria: Secondary | ICD-10-CM | POA: Insufficient documentation

## 2019-12-10 DIAGNOSIS — R369 Urethral discharge, unspecified: Secondary | ICD-10-CM | POA: Insufficient documentation

## 2019-12-10 DIAGNOSIS — Z202 Contact with and (suspected) exposure to infections with a predominantly sexual mode of transmission: Secondary | ICD-10-CM | POA: Insufficient documentation

## 2019-12-10 LAB — URINALYSIS, ROUTINE W REFLEX MICROSCOPIC
Bilirubin Urine: NEGATIVE
Glucose, UA: NEGATIVE mg/dL
Hgb urine dipstick: NEGATIVE
Ketones, ur: NEGATIVE mg/dL
Nitrite: NEGATIVE
Protein, ur: 30 mg/dL — AB
Specific Gravity, Urine: 1.027 (ref 1.005–1.030)
pH: 6 (ref 5.0–8.0)

## 2019-12-10 MED ORDER — DOXYCYCLINE HYCLATE 100 MG PO CAPS: 100.0000 mg | ORAL_CAPSULE | Freq: Two times a day (BID) | ORAL | 0 refills | Status: AC

## 2019-12-10 MED ORDER — CEFTRIAXONE SODIUM 500 MG IJ SOLR
500.0000 mg | Freq: Once | INTRAMUSCULAR | Status: AC
  Administered 2019-12-10: 500 mg via INTRAMUSCULAR
  Filled 2019-12-10: qty 500

## 2019-12-10 MED ORDER — STERILE WATER FOR INJECTION IJ SOLN
INTRAMUSCULAR | Status: AC
  Administered 2019-12-10: 10 mL
  Filled 2019-12-10: qty 10

## 2019-12-10 MED ORDER — DOXYCYCLINE HYCLATE 100 MG PO TABS
100.0000 mg | ORAL_TABLET | Freq: Once | ORAL | Status: AC
  Administered 2019-12-10: 100 mg via ORAL
  Filled 2019-12-10: qty 1

## 2019-12-10 NOTE — ED Notes (Signed)
Meds given per MAR. Name/DOB verified with pt. Urine collected, labeled with 2 pt identifiers, and sent to lab

## 2019-12-10 NOTE — ED Notes (Signed)
PA at bedside.

## 2019-12-10 NOTE — ED Triage Notes (Signed)
Pt here for evaluation of penile discharge, discomfort, and burning with urination x 3 weeks.

## 2019-12-10 NOTE — ED Provider Notes (Signed)
[facility] EMERGENCY DEPARTMENT Provider Note   CSN: [id number] Arrival date & time: 12/10/19  1332     History Chief Complaint  Patient presents with  . SEXUALLY TRANSMITTED DISEASE    Cody Gibson is a 24 y.o. male with no significant past medical history who presents to the ED due to penile discharge and dysuria x3 weeks.  Patient admits to yellow/greenish penile discharge that has progressively gotten worse.  He admits to 2 sexual partners without protection.  History of gonorrhea in the past.  Denies fever, chills, abdominal pain, pain with defecation, testicular pain, and testicular swelling.  No alleviating or aggravating factors.  History obtained from patient and past medical records. No interpreter used during encounter.      No past medical history on file.  There are no problems to display for this patient.   No past surgical history on file.     No family history on file.  Social History   Tobacco Use  . Smoking status: Former Smoker    Packs/day: 0.25    Types: Cigarettes    Quit date: 06/30/2019    Years since quitting: 0.4  . Smokeless tobacco: Never Used  Substance Use Topics  . Alcohol use: No  . Drug use: No    Home Medications Prior to Admission medications   Medication Sig Start Date End Date Taking? Authorizing Provider  cyclobenzaprine (FLEXERIL) 10 MG tablet Take 1 tablet (10 mg total) by mouth 2 (two) times daily as needed for muscle spasms. Patient not taking: Reported on 11/27/2014 10/20/14   Elpidio Anis, PA-C  doxycycline (VIBRAMYCIN) 100 MG capsule Take 1 capsule (100 mg total) by mouth 2 (two) times daily. 12/16/15   Cheri Fowler, PA-C  doxycycline (VIBRAMYCIN) 100 MG capsule Take 1 capsule (100 mg total) by mouth 2 (two) times daily for 7 days. 12/10/19 12/17/19  Mannie Stabile, PA-C  ibuprofen (ADVIL,MOTRIN) 800 MG tablet Take 1 tablet (800 mg total) by mouth 3 (three) times daily. 03/04/15   Elpidio Anis, PA-C    phenazopyridine (PYRIDIUM) 200 MG tablet Take 1 tablet (200 mg total) by mouth 3 (three) times daily. Patient not taking: Reported on 03/04/2015 11/28/14   Lurene Shadow, PA-C    Allergies    Patient has no known allergies.  Review of Systems   Review of Systems  Constitutional: Negative for chills and fever.  Gastrointestinal: Negative for abdominal pain, diarrhea, nausea and vomiting.  Genitourinary: Positive for discharge, dysuria and penile pain. Negative for difficulty urinating, genital sores, hematuria, penile swelling, scrotal swelling and testicular pain.  Musculoskeletal: Negative for back pain.  All other systems reviewed and are negative.   Physical Exam Updated Vital Signs BP 124/77 (BP Location: Right Arm)   Pulse 92   Temp 98.1 F (36.7 C) (Oral)   Resp 16   Ht 5\' 9"  (1.753 m)   Wt 63.5 kg   SpO2 98%   BMI 20.67 kg/m   Physical Exam Vitals and nursing note reviewed. Exam conducted with a chaperone present.  Constitutional:      General: He is not in acute distress.    Appearance: He is not ill-appearing.  HENT:     Head: Normocephalic.  Eyes:     Conjunctiva/sclera: Conjunctivae normal.  Cardiovascular:     Rate and Rhythm: Normal rate and regular rhythm.     Pulses: Normal pulses.     Heart sounds: Normal heart sounds. No murmur. No friction rub. No  gallop.   Pulmonary:     Effort: Pulmonary effort is normal.     Breath sounds: Normal breath sounds.  Abdominal:     General: Abdomen is flat. There is no distension.     Palpations: Abdomen is soft.     Tenderness: There is no abdominal tenderness. There is no right CVA tenderness, left CVA tenderness, guarding or rebound.     Comments: Abdomen soft, nondistended, nontender to palpation in all quadrants without guarding or peritoneal signs. No rebound.   Genitourinary:    Pubic Area: No rash.      Penis: Normal and circumcised. No phimosis or paraphimosis.      Testes: Normal.        Right: Mass,  tenderness or swelling not present.        Left: Tenderness or swelling not present.     Epididymis:     Right: Normal.     Left: Normal.     Comments: Normal circumcised penis with mild clear discharge.  No genital lesions.  No testicular tenderness or swelling Musculoskeletal:     Cervical back: Neck supple.     Comments: Able to move all 4 extremities without difficulty.  Skin:    General: Skin is warm and dry.  Neurological:     General: No focal deficit present.     Mental Status: He is alert.  Psychiatric:        Mood and Affect: Mood normal.        Behavior: Behavior normal.     ED Results / Procedures / Treatments   Labs (all labs ordered are listed, but only abnormal results are displayed) Labs Reviewed  URINALYSIS, ROUTINE W REFLEX MICROSCOPIC - Abnormal; Notable for the following components:      Result Value   APPearance HAZY (*)    Protein, ur 30 (*)    Leukocytes,Ua LARGE (*)    Bacteria, UA RARE (*)    All other components within normal limits  URINE CULTURE  GC/CHLAMYDIA PROBE AMP () NOT AT [facility]    EKG None  Radiology No results found.  Procedures Procedures (including critical care time)  Medications Ordered in ED Medications  cefTRIAXone (ROCEPHIN) injection 500 mg (500 mg Intramuscular Given 12/10/19 1543)  doxycycline (VIBRA-TABS) tablet 100 mg (100 mg Oral Given 12/10/19 1542)  sterile water (preservative free) injection (10 mLs  Given 12/10/19 1543)    ED Course  I have reviewed the triage vital signs and the nursing notes.  Pertinent labs & imaging results that were available during my care of the patient were reviewed by me and considered in my medical decision making (see chart for details).  Clinical Course as of Dec 10 1618  Tue Dec 10, 2019  1617 Leukocytes,Ua(!): LARGE [CA]  1617 WBC, UA: 21-50 [CA]    Clinical Course User Index [CA] Mannie Stabile, PA-C   MDM Rules/Calculators/A&P                       24 year old male presents to the ED due to penile discharge and dysuria x3 weeks. Recently switched sexual partners.  History of gonorrhea in the past.  Patient is afebrile without abdominal tenderness, abdominal pain or painful bowel movements to indicate prostatitis.  No tenderness to palpation of the testes or epididymis to suggest orchitis or epididymitis.  STD cultures obtained including gonorrhea and chlamydia. Patient deferred HIV and syphilis testing.  UA significant for large leukocytes and rare bacteria.  Suspect leukocytes due to contamination from penile discharge.  Urine culture pending.  Patient to be discharged with instructions to follow up with PCP. Discussed importance of using protection when sexually active. Pt understands that they have GC/Chlamydia cultures pending and that they will need to inform all sexual partners if results return positive. Patient has been treated prophylactically with Doxycycline and Rocephin.  Patient advised to continue taking doxycycline as prescribed and finish all antibiotics. Strict ED precautions discussed with patient. Patient states understanding and agrees to plan. Patient discharged home in no acute distress and stable vitals.  Final Clinical Impression(s) / ED Diagnoses Final diagnoses:  Penile discharge  Dysuria  Possible exposure to STD    Rx / DC Orders ED Discharge Orders         Ordered    doxycycline (VIBRAMYCIN) 100 MG capsule  2 times daily     12/10/19 1620           Karie Kirks 12/10/19 1621    Veryl Speak, MD 12/10/19 2355

## 2019-12-10 NOTE — ED Notes (Signed)
Patient verbalizes understanding of discharge instructions, follow up care, and prescription medications. Opportunity for questioning and answers were provided. All questions answered completely. Armband removed by staff, pt discharged from ED. Ambulatory with strong, steady gait

## 2019-12-10 NOTE — ED Notes (Signed)
Call made to micro to add run gray top for urine culture

## 2019-12-10 NOTE — Discharge Instructions (Signed)
As discussed, your STD test is pending. You were treated here in the ED. Treatment also included 7 days of antibiotics. Make sure to finish all antibiotics and take as prescribed. Follow-up with PCP if symptoms do not improve. If your tests are positive, your sexual partners need to be treated as well. Use protection while having intercourse. Return to the ER for new or worsening symptoms.

## 2019-12-11 LAB — URINE CULTURE: Culture: NO GROWTH

## 2020-02-06 ENCOUNTER — Ambulatory Visit: Payer: Self-pay

## 2021-02-18 DIAGNOSIS — Z1388 Encounter for screening for disorder due to exposure to contaminants: Secondary | ICD-10-CM | POA: Diagnosis not present

## 2021-02-18 DIAGNOSIS — Z3009 Encounter for other general counseling and advice on contraception: Secondary | ICD-10-CM | POA: Diagnosis not present

## 2021-02-18 DIAGNOSIS — Z0389 Encounter for observation for other suspected diseases and conditions ruled out: Secondary | ICD-10-CM | POA: Diagnosis not present

## 2024-07-10 ENCOUNTER — Encounter (HOSPITAL_COMMUNITY): Payer: Self-pay

## 2024-07-10 ENCOUNTER — Ambulatory Visit (HOSPITAL_COMMUNITY)
Admission: EM | Admit: 2024-07-10 | Discharge: 2024-07-10 | Disposition: A | Discharge status: Home / Self Care | Payer: Self-pay

## 2024-07-10 DIAGNOSIS — N342 Other urethritis: Secondary | ICD-10-CM | POA: Insufficient documentation

## 2024-07-10 DIAGNOSIS — R300 Dysuria: Secondary | ICD-10-CM

## 2024-07-10 LAB — POCT URINALYSIS DIP (MANUAL ENTRY)
Bilirubin, UA: NEGATIVE
Glucose, UA: NEGATIVE mg/dL
Ketones, POC UA: NEGATIVE mg/dL
Leukocytes, UA: NEGATIVE
Nitrite, UA: NEGATIVE
Protein Ur, POC: NEGATIVE mg/dL
Spec Grav, UA: 1.025 (ref 1.010–1.025)
Urobilinogen, UA: 0.2 U/dL
pH, UA: 5.5 (ref 5.0–8.0)

## 2024-07-10 MED ORDER — PHENAZOPYRIDINE HCL 200 MG PO TABS: 200.0000 mg | ORAL_TABLET | Freq: Three times a day (TID) | ORAL | 0 refills | Status: AC

## 2024-07-10 NOTE — Discharge Instructions (Addendum)
  1. Urethritis (Primary) - POC urinalysis dipstick completed in UC shows no leukocytes, no nitrite, small blood, these findings are not strongly indicative of urinary tract infection - Cytology swab-Urethral; GC / Chlamydia, Trichomonas collected in UC and sent to lab for further testing results will be available in 2 to 3 days. - Urine culture collected in UC and sent to lab for further testing results should be available in 2 to 3 days. -Continue to monitor symptoms for any change in severity if there is any escalation of current symptoms or development of new symptoms follow-up in ER for further evaluation and management.

## 2024-07-10 NOTE — ED Provider Notes (Signed)
 [facility]  Note:  This document was prepared using Conservation officer, historic buildings and may include unintentional dictation errors.  MRN: [id number] DOB: 1996-03-20  Subjective:   Cody Gibson is a 28 y.o. male presenting for evaluation for mild penile discomfort x 4 days.  Patient reports that his penis feels like there is tingling and irritation despite any known injury or exposure to illness.  Patient reports he has had similar symptoms in the past but tested negative for everything was prescribed Pyridium  which seemed to help with symptoms.  Patient denies any penile discharge, penile lesions, abdominal pain, flank pain.  No current facility-administered medications for this encounter.  Current Outpatient Medications:    phenazopyridine  (PYRIDIUM ) 200 MG tablet, Take 1 tablet (200 mg total) by mouth 3 (three) times daily., Disp: 6 tablet, Rfl: 0   doxycycline  (VIBRAMYCIN ) 100 MG capsule, Take 1 capsule (100 mg total) by mouth 2 (two) times daily., Disp: 20 capsule, Rfl: 0   ibuprofen  (ADVIL ,MOTRIN ) 800 MG tablet, Take 1 tablet (800 mg total) by mouth 3 (three) times daily., Disp: 21 tablet, Rfl: 0   No Known Allergies  History reviewed. No pertinent past medical history.   History reviewed. No pertinent surgical history.  History reviewed. No pertinent family history.  Social History   Tobacco Use   Smoking status: Former    Current packs/day: 0.00    Types: Cigarettes    Quit date: 06/30/2019    Years since quitting: 5.0   Smokeless tobacco: Never  Substance Use Topics   Alcohol use: No   Drug use: No    ROS Refer to HPI for ROS details.  Objective:    Vitals: BP 118/74 (BP Location: Right Arm)   Pulse 76   Temp 97.8 F (36.6 C) (Oral)   Resp 16   SpO2 96%   Physical Exam Vitals and nursing note reviewed.  Constitutional:      General: He is not in acute distress.    Appearance: Normal appearance. He is well-developed. He is not  ill-appearing or toxic-appearing.  HENT:     Head: Normocephalic.  Cardiovascular:     Rate and Rhythm: Normal rate.  Pulmonary:     Effort: Pulmonary effort is normal. No respiratory distress.  Genitourinary:    Penis: No erythema, tenderness, discharge, swelling or lesions.   Skin:    General: Skin is warm and dry.  Neurological:     General: No focal deficit present.     Mental Status: He is alert and oriented to person, place, and time.  Psychiatric:        Mood and Affect: Mood normal.        Behavior: Behavior normal.     Procedures  Results for orders placed or performed during the hospital encounter of 07/10/24 (from the past 24 hours)  POC urinalysis dipstick     Status: Abnormal   Collection Time: 07/10/24  2:28 PM  Result Value Ref Range   Color, UA yellow yellow   Clarity, UA clear clear   Glucose, UA negative negative mg/dL   Bilirubin, UA negative negative   Ketones, POC UA negative negative mg/dL   Spec Grav, UA 8.974 8.989 - 1.025   Blood, UA small (A) negative   pH, UA 5.5 5.0 - 8.0   Protein Ur, POC negative negative mg/dL   Urobilinogen, UA 0.2 0.2 or 1.0 E.U./dL   Nitrite, UA Negative Negative   Leukocytes, UA Negative Negative    Assessment  and Plan :     Discharge Instructions       1. Urethritis (Primary) - POC urinalysis dipstick completed in UC shows no leukocytes, no nitrite, small blood, these findings are not strongly indicative of urinary tract infection - Cytology swab-Urethral; GC / Chlamydia, Trichomonas collected in UC and sent to lab for further testing results will be available in 2 to 3 days. - Urine culture collected in UC and sent to lab for further testing results should be available in 2 to 3 days. -Continue to monitor symptoms for any change in severity if there is any escalation of current symptoms or development of new symptoms follow-up in ER for further evaluation and management.      Elisabella Hacker B Gemini Beaumier    Elleen Coulibaly, [place] B, [place] 07/10/24 1448

## 2024-07-10 NOTE — ED Triage Notes (Signed)
 Pt states he is having penile discomfort for the past 4 days. Denies any discharge.

## 2024-07-11 LAB — URINE CULTURE
Culture: NO GROWTH
Special Requests: NORMAL

## 2024-07-11 LAB — CYTOLOGY, (ORAL, ANAL, URETHRAL) ANCILLARY ONLY
Chlamydia: POSITIVE — AB
Comment: NEGATIVE
Comment: NEGATIVE
Comment: NORMAL
Neisseria Gonorrhea: NEGATIVE
Trichomonas: NEGATIVE

## 2024-07-12 ENCOUNTER — Ambulatory Visit (HOSPITAL_COMMUNITY): Payer: Self-pay

## 2024-07-12 MED ORDER — DOXYCYCLINE HYCLATE 100 MG PO TABS: 100.0000 mg | ORAL_TABLET | Freq: Two times a day (BID) | ORAL | 0 refills | Status: AC

## 2024-08-01 ENCOUNTER — Ambulatory Visit (HOSPITAL_COMMUNITY)
Admission: EM | Admit: 2024-08-01 | Discharge: 2024-08-01 | Disposition: A | Discharge status: Home / Self Care | Payer: Self-pay | Attending: Emergency Medicine | Admitting: Emergency Medicine

## 2024-08-01 ENCOUNTER — Encounter (HOSPITAL_COMMUNITY): Payer: Self-pay | Admitting: *Deleted

## 2024-08-01 ENCOUNTER — Other Ambulatory Visit: Payer: Self-pay

## 2024-08-01 DIAGNOSIS — A749 Chlamydial infection, unspecified: Secondary | ICD-10-CM | POA: Insufficient documentation

## 2024-08-01 DIAGNOSIS — Z113 Encounter for screening for infections with a predominantly sexual mode of transmission: Secondary | ICD-10-CM | POA: Insufficient documentation

## 2024-08-01 MED ORDER — AZITHROMYCIN 250 MG PO TABS
ORAL_TABLET | ORAL | Status: AC
  Filled 2024-08-01: qty 4

## 2024-08-01 MED ORDER — AZITHROMYCIN 250 MG PO TABS
1000.0000 mg | ORAL_TABLET | Freq: Once | ORAL | Status: AC
  Administered 2024-08-01: 1000 mg via ORAL

## 2024-08-01 NOTE — Discharge Instructions (Addendum)
 You have been treated for chlamydia today in clinic with azithromycin .  Abstain from intercourse for the next 7 days to ensure the antibiotic has had time to fully clear the infection in your body.  Return to clinic for any new or urgent symptoms.

## 2024-08-01 NOTE — ED Provider Notes (Signed)
 [facility]    CSN: [id number] Arrival date & time: 08/01/24  1711      History   Chief Complaint Chief Complaint  Patient presents with   SEXUALLY TRANSMITTED DISEASE    HPI Cody Gibson is a 28 y.o. male.   Patient presents to clinic over concerns of continued urethral discomfort.  He was seen at the end of October for the same symptoms.  Cytology swab showed chlamydia, sent in doxycycline .  Patient reports he works 12-hour shifts and has difficulty taking medications.  Thinks he took around 2 or 3 days of the doxycycline  and then lost the prescription.  Since then he has had continued urethral irritation.  Denies any penile discharge.  Has not had intercourse since last visit.  The history is provided by the patient and medical records.    History reviewed. No pertinent past medical history.  There are no active problems to display for this patient.   History reviewed. No pertinent surgical history.     Home Medications    Prior to Admission medications   Medication Sig Start Date End Date Taking? Authorizing Provider  doxycycline  (VIBRAMYCIN ) 100 MG capsule Take 1 capsule (100 mg total) by mouth 2 (two) times daily. 12/16/15   Rose, Kayla, PA-C  ibuprofen  (ADVIL ,MOTRIN ) 800 MG tablet Take 1 tablet (800 mg total) by mouth 3 (three) times daily. 03/04/15   Odell Balls, PA-C  phenazopyridine  (PYRIDIUM ) 200 MG tablet Take 1 tablet (200 mg total) by mouth 3 (three) times daily. 07/10/24   Aurea Ethel NOVAK, NP    Family History History reviewed. No pertinent family history.  Social History Social History   Tobacco Use   Smoking status: Former    Current packs/day: 0.00    Types: Cigarettes    Quit date: 06/30/2019    Years since quitting: 5.0   Smokeless tobacco: Never  Substance Use Topics   Alcohol use: No   Drug use: No     Allergies   Patient has no known allergies.   Review of Systems Review of Systems  Per HPI  Physical  Exam Triage Vital Signs ED Triage Vitals  Encounter Vitals Group     BP 08/01/24 1848 102/86     Girls Systolic BP Percentile --      Girls Diastolic BP Percentile --      Boys Systolic BP Percentile --      Boys Diastolic BP Percentile --      Pulse Rate 08/01/24 1848 70     Resp 08/01/24 1848 18     Temp 08/01/24 1848 98.9 F (37.2 C)     Temp src --      SpO2 08/01/24 1848 97 %     Weight --      Height --      Head Circumference --      Peak Flow --      Pain Score 08/01/24 1845 0     Pain Loc --      Pain Education --      Exclude from Growth Chart --    No data found.  Updated Vital Signs BP 102/86   Pulse 70   Temp 98.9 F (37.2 C)   Resp 18   SpO2 97%   Visual Acuity Right Eye Distance:   Left Eye Distance:   Bilateral Distance:    Right Eye Near:   Left Eye Near:    Bilateral Near:     Physical Exam  Vitals and nursing note reviewed.  Constitutional:      Appearance: Normal appearance.  HENT:     Head: Normocephalic and atraumatic.     Right Ear: External ear normal.     Left Ear: External ear normal.     Nose: Nose normal.     Mouth/Throat:     Mouth: Mucous membranes are moist.  Eyes:     Conjunctiva/sclera: Conjunctivae normal.  Cardiovascular:     Rate and Rhythm: Normal rate.  Pulmonary:     Effort: Pulmonary effort is normal. No respiratory distress.  Neurological:     General: No focal deficit present.     Mental Status: He is alert.  Psychiatric:        Mood and Affect: Mood normal.      UC Treatments / Results  Labs (all labs ordered are listed, but only abnormal results are displayed) Labs Reviewed  CYTOLOGY, (ORAL, ANAL, URETHRAL) ANCILLARY ONLY    EKG   Radiology No results found.  Procedures Procedures (including critical care time)  Medications Ordered in UC Medications  azithromycin  (ZITHROMAX ) tablet 1,000 mg (has no administration in time range)    Initial Impression / Assessment and Plan / UC Course   I have reviewed the triage vital signs and the nursing notes.  Pertinent labs & imaging results that were available during my care of the patient were reviewed by me and considered in my medical decision making (see chart for details).  Vitals in triage reviewed, patient is hemodynamically stable.  Incomplete treatment for chlamydia with doxycycline .  Continues to be symptomatic.  Will treat empirically in clinic with azithromycin .  Repeat cytology swab obtained.  Plan of care, follow-up care return precautions given, no questions at this time.    Final Clinical Impressions(s) / UC Diagnoses   Final diagnoses:  Encounter for screening examination for sexually transmitted infection  Chlamydia infection     Discharge Instructions      You have been treated for chlamydia today in clinic with azithromycin .  Abstain from intercourse for the next 7 days to ensure the antibiotic has had time to fully clear the infection in your body.  Return to clinic for any new or urgent symptoms.    ED Prescriptions   None    PDMP not reviewed this encounter.   Dreama Dallis SAILOR, FNP 08/01/24 1914

## 2024-08-01 NOTE — ED Triage Notes (Signed)
 PT reports he lost the pills he was prescribed on 07-10-24. Pt wants the pills given here at [facility]. Pt still has STD Sx's.

## 2024-08-02 LAB — CYTOLOGY, (ORAL, ANAL, URETHRAL) ANCILLARY ONLY
Chlamydia: NEGATIVE
Comment: NEGATIVE
Comment: NEGATIVE
Comment: NORMAL
Neisseria Gonorrhea: NEGATIVE
Trichomonas: NEGATIVE
